# Patient Record
Sex: Male | Born: 1950 | Race: White | Hispanic: No | Marital: Married | Smoking: Never smoker
Health system: Southern US, Community
[De-identification: ages and names within clinical notes are randomized; demographics above are authoritative.]

## PROBLEM LIST (undated history)

## (undated) DIAGNOSIS — M7020 Olecranon bursitis, unspecified elbow: Secondary | ICD-10-CM

## (undated) DIAGNOSIS — I10 Essential (primary) hypertension: Secondary | ICD-10-CM

## (undated) DIAGNOSIS — Z85828 Personal history of other malignant neoplasm of skin: Secondary | ICD-10-CM

## (undated) DIAGNOSIS — E7800 Pure hypercholesterolemia, unspecified: Secondary | ICD-10-CM

## (undated) DIAGNOSIS — D239 Other benign neoplasm of skin, unspecified: Secondary | ICD-10-CM

## (undated) DIAGNOSIS — R1013 Epigastric pain: Secondary | ICD-10-CM

## (undated) DIAGNOSIS — K449 Diaphragmatic hernia without obstruction or gangrene: Secondary | ICD-10-CM

## (undated) DIAGNOSIS — K219 Gastro-esophageal reflux disease without esophagitis: Secondary | ICD-10-CM

## (undated) DIAGNOSIS — R972 Elevated prostate specific antigen [PSA]: Secondary | ICD-10-CM

## (undated) DIAGNOSIS — R739 Hyperglycemia, unspecified: Secondary | ICD-10-CM

## (undated) DIAGNOSIS — M109 Gout, unspecified: Secondary | ICD-10-CM

## (undated) DIAGNOSIS — E785 Hyperlipidemia, unspecified: Secondary | ICD-10-CM

## (undated) HISTORY — PX: GANGLION CYST EXCISION: SHX1691

## (undated) HISTORY — PX: TONSILLECTOMY: SUR1361

## (undated) HISTORY — PX: WISDOM TOOTH EXTRACTION: SHX21

## (undated) HISTORY — DX: Hyperlipidemia, unspecified: E78.5

---

## 2004-12-13 ENCOUNTER — Inpatient Hospital Stay: Payer: Self-pay | Admitting: Internal Medicine

## 2004-12-13 ENCOUNTER — Other Ambulatory Visit: Payer: Self-pay

## 2006-07-16 IMAGING — US US CAROTID DUPLEX BILAT
1 series · 14 of 24 positions shown · non-contrast
Comparison: none

REASON FOR EXAM: Status post  TIA/CVA
COMMENTS:

[Series 1: us carotid duplex bilat · 0.09mm/px · 14 of 45 slices shown]
[im 1/45]
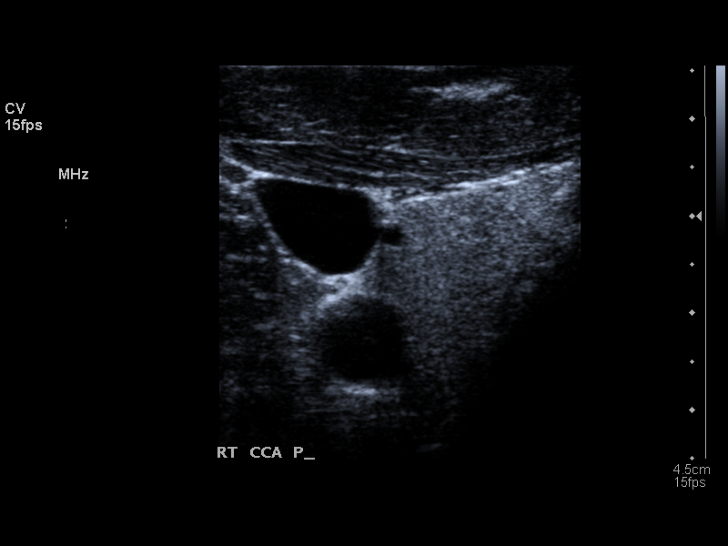
[im 4/45]
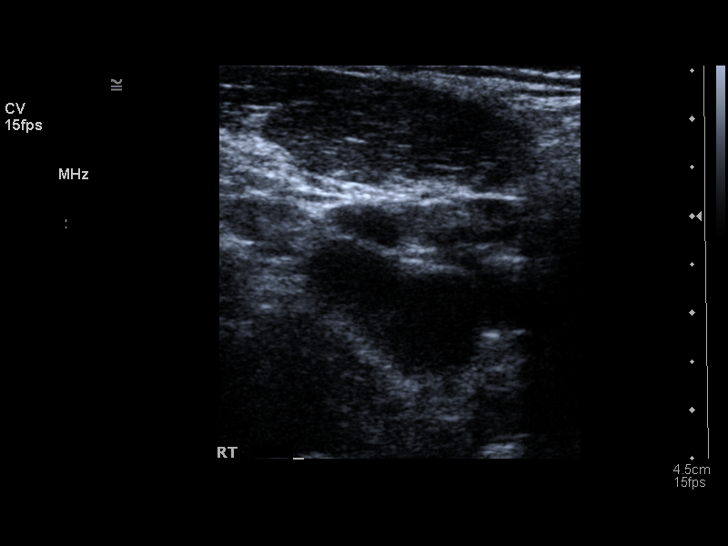
[im 8/45]
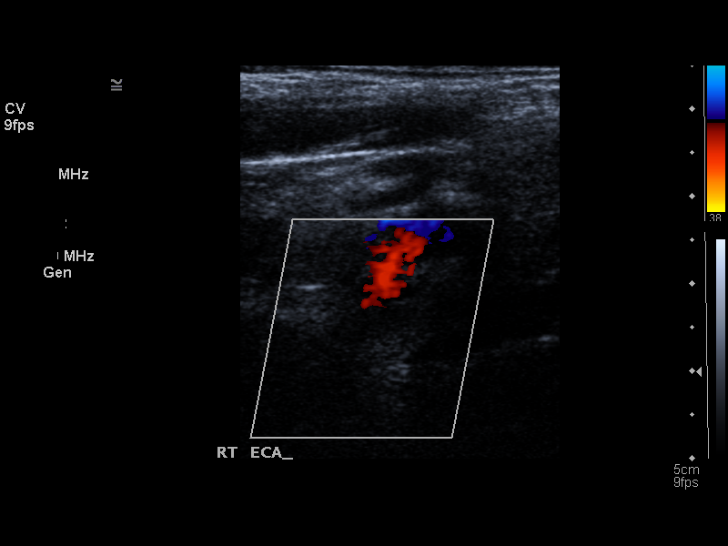
[im 12/45]
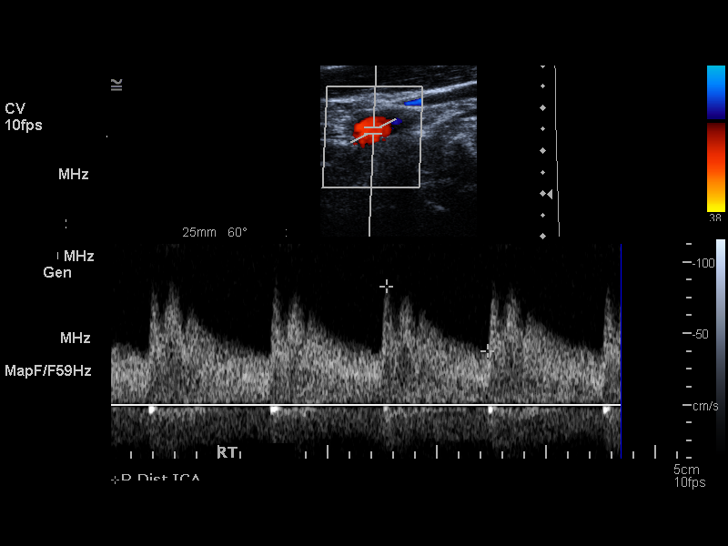
[im 14/45]
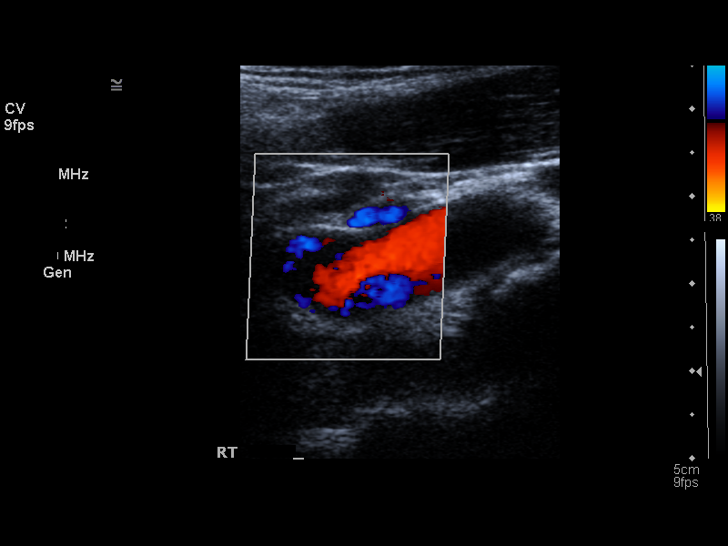
[im 18/45]
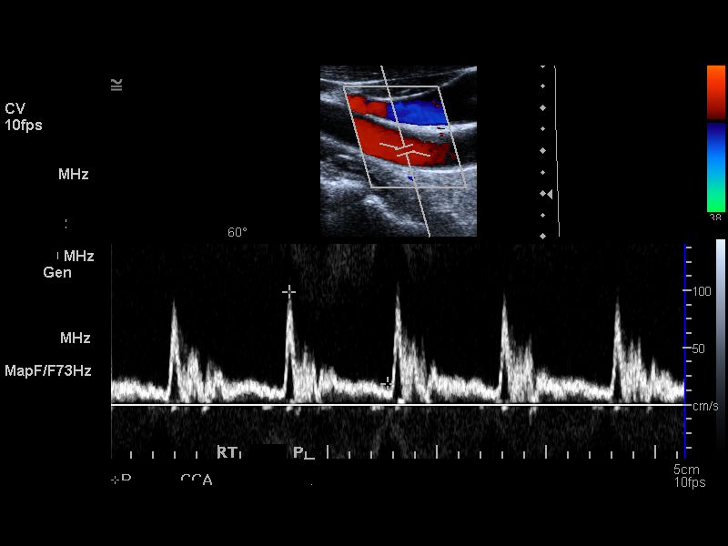
[im 22/45]
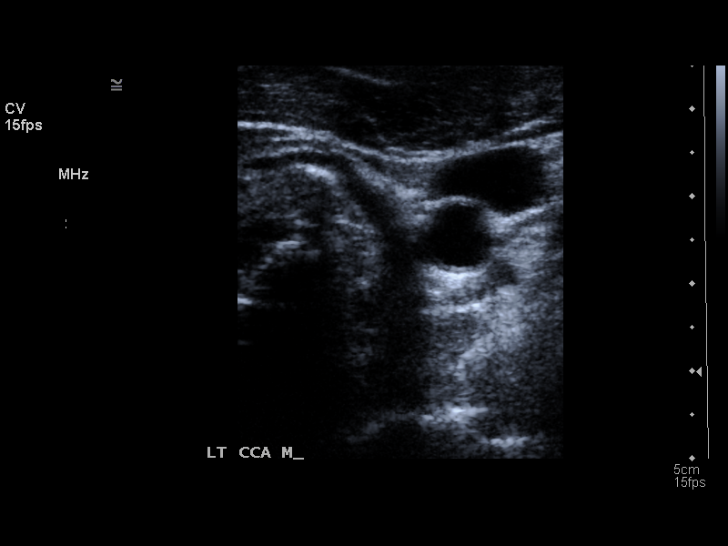
[im 23/45]
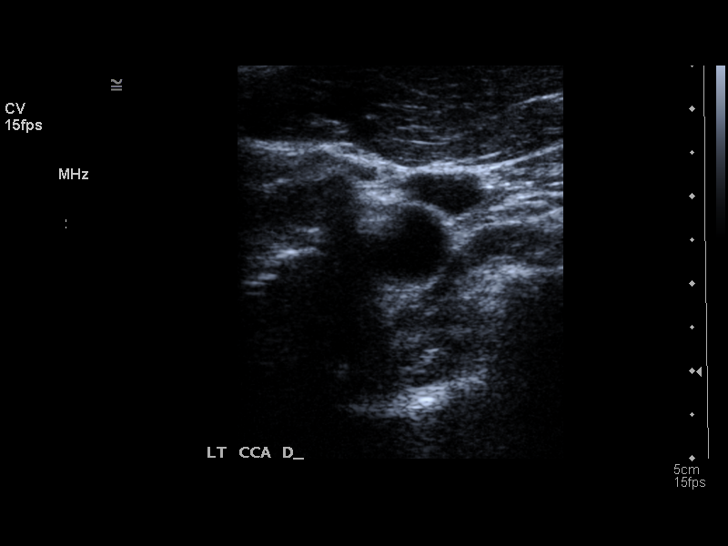
[im 27/45]
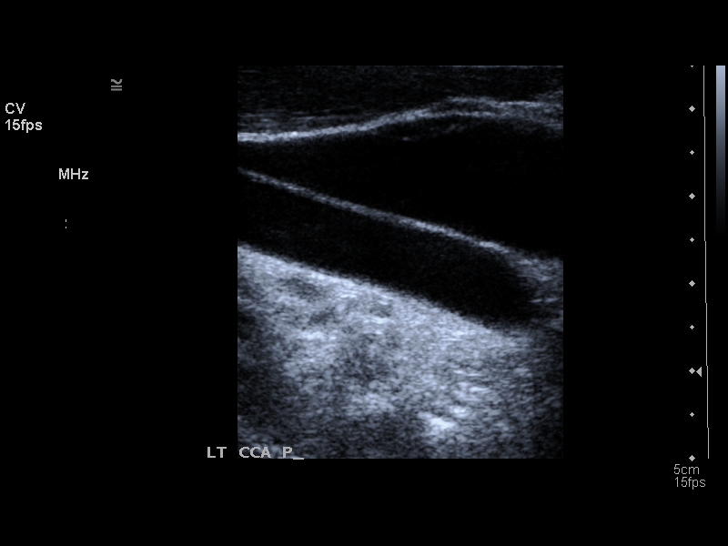
[im 31/45]
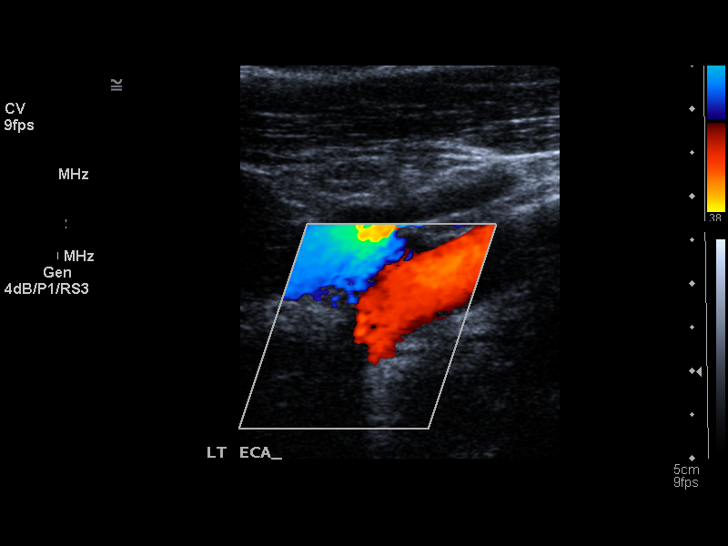
[im 35/45]
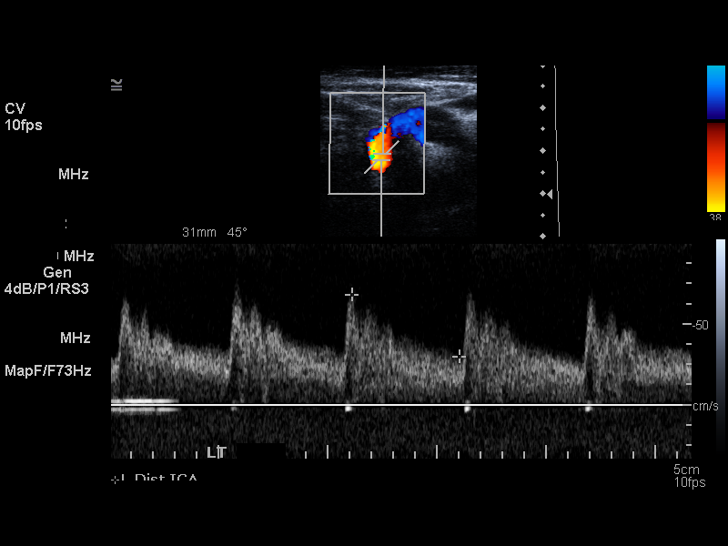
[im 37/45]
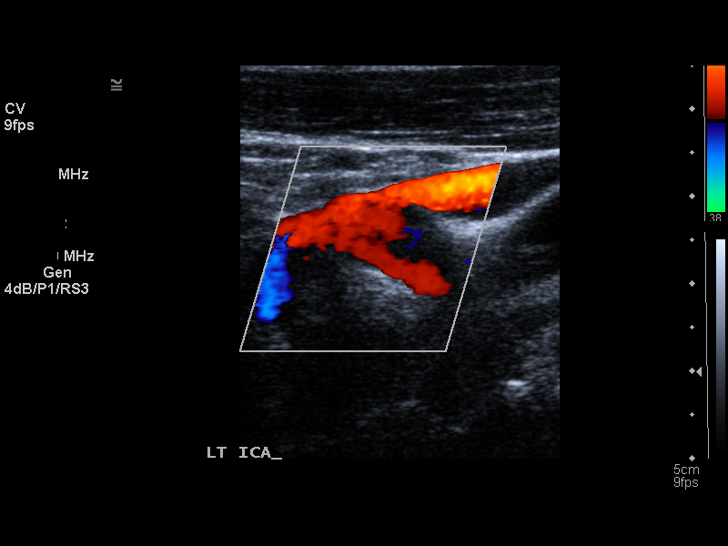
[im 41/45]
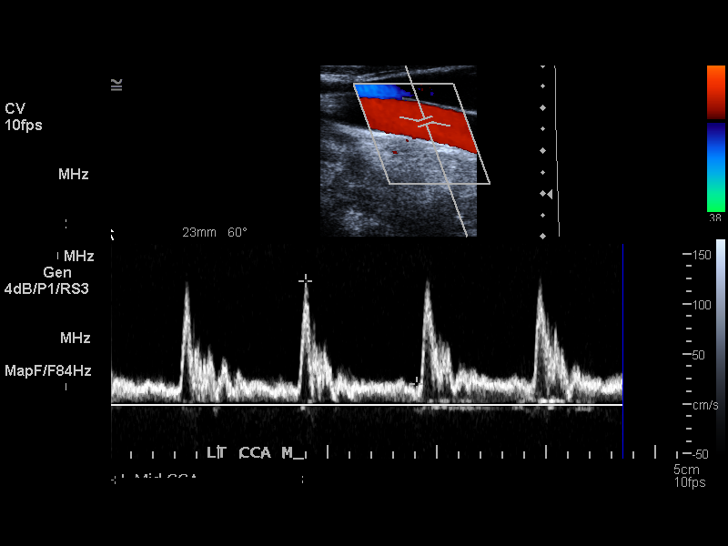
[im 45/45]
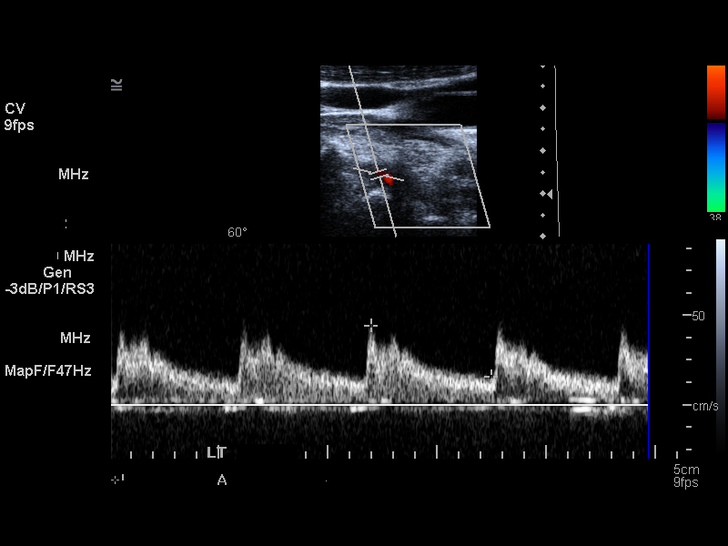

[14 of 24 positions shown; findings below may reference images not displayed]

PROCEDURE:     US  - US CAROTID DOPPLER BILATERAL  - December 13, 2004 [DATE]

RESULT:     The patient has TIA symptoms.

On the RIGHT, the peak internal carotid systolic velocity measured 83.0 cm
per second with peak common carotid velocity measuring 111.0 cm per second,
corresponding to a ratio of 0.8.

On the LEFT, the peak internal carotid systolic velocity measured 95.0 cm
per second with peak common carotid velocity measuring 124.0 cm per second,
corresponding to a ratio of 0.8.

The vertebral arteries are normal in flow direction bilaterally.
IMPRESSION: I see no evidence of hemodynamically significant carotid
stenosis.

## 2011-10-27 DIAGNOSIS — K449 Diaphragmatic hernia without obstruction or gangrene: Secondary | ICD-10-CM | POA: Insufficient documentation

## 2011-11-03 DIAGNOSIS — R739 Hyperglycemia, unspecified: Secondary | ICD-10-CM | POA: Insufficient documentation

## 2013-04-18 DIAGNOSIS — M109 Gout, unspecified: Secondary | ICD-10-CM | POA: Insufficient documentation

## 2015-05-01 ENCOUNTER — Emergency Department
Admission: EM | Admit: 2015-05-01 | Discharge: 2015-05-01 | Disposition: A | Discharge status: Home / Self Care | Payer: BLUE CROSS/BLUE SHIELD | Attending: Emergency Medicine | Admitting: Emergency Medicine

## 2015-05-01 ENCOUNTER — Emergency Department: Payer: BLUE CROSS/BLUE SHIELD

## 2015-05-01 DIAGNOSIS — S63284A Dislocation of proximal interphalangeal joint of right ring finger, initial encounter: Secondary | ICD-10-CM | POA: Insufficient documentation

## 2015-05-01 DIAGNOSIS — W108XXA Fall (on) (from) other stairs and steps, initial encounter: Secondary | ICD-10-CM | POA: Insufficient documentation

## 2015-05-01 DIAGNOSIS — Y9389 Activity, other specified: Secondary | ICD-10-CM | POA: Diagnosis not present

## 2015-05-01 DIAGNOSIS — S63434A Traumatic rupture of volar plate of right ring finger at metacarpophalangeal and interphalangeal joint, initial encounter: Secondary | ICD-10-CM | POA: Diagnosis not present

## 2015-05-01 DIAGNOSIS — S63639A Sprain of interphalangeal joint of unspecified finger, initial encounter: Secondary | ICD-10-CM

## 2015-05-01 DIAGNOSIS — Y9289 Other specified places as the place of occurrence of the external cause: Secondary | ICD-10-CM | POA: Insufficient documentation

## 2015-05-01 DIAGNOSIS — Y998 Other external cause status: Secondary | ICD-10-CM | POA: Insufficient documentation

## 2015-05-01 DIAGNOSIS — S6991XA Unspecified injury of right wrist, hand and finger(s), initial encounter: Secondary | ICD-10-CM | POA: Diagnosis present

## 2015-05-01 DIAGNOSIS — IMO0001 Reserved for inherently not codable concepts without codable children: Secondary | ICD-10-CM

## 2015-05-01 MED ORDER — OXYCODONE-ACETAMINOPHEN 5-325 MG PO TABS
1.0000 | ORAL_TABLET | Freq: Once | ORAL | Status: AC
  Administered 2015-05-01: 1 via ORAL
  Filled 2015-05-01: qty 1

## 2015-05-01 MED ORDER — LIDOCAINE HCL (PF) 1 % IJ SOLN
INTRAMUSCULAR | Status: AC
  Filled 2015-05-01: qty 5

## 2015-05-01 NOTE — ED Provider Notes (Addendum)
[facility] Emergency Department Provider Note  ____________________________________________  Time seen: Approximately 284 PM  I have reviewed the triage vital signs and the nursing notes.   HISTORY  Chief Complaint Finger Injury    HPI James Haas is a 64 y.o. male without any chronic medical issues and is presenting tonight with right ring finger pain and deformity after fall. He says that he was started walk up the stairs when he tripped and fell forward and caught himself awkwardly with his right ring finger which she had a key ring wrapped around. He did not hit his head or lose consciousness.  He is unable to move the finger at this time.   No past medical history on file.  There are no active problems to display for this patient.   No past surgical history on file.  No current outpatient prescriptions on file.  Allergies Review of patient's allergies indicates not on file.  No family history on file.  Social History Social History  Substance Use Topics  . Smoking status: Not on file  . Smokeless tobacco: Not on file  . Alcohol Use: Not on file    Review of Systems Constitutional: No fever/chills Eyes: No visual changes. ENT: No sore throat. Cardiovascular: Denies chest pain. Respiratory: Denies shortness of breath. Gastrointestinal: No abdominal pain.  No nausea, no vomiting.  No diarrhea.  No constipation. Genitourinary: Negative for dysuria. Musculoskeletal: Negative for back pain. Skin: Negative for rash. Neurological: Negative for headaches, focal weakness or numbness.  10-point ROS otherwise negative.  ____________________________________________   PHYSICAL EXAM:  VITAL SIGNS: ED Triage Vitals  Enc Vitals Group     BP 05/01/15 2030 88/56 mmHg     Pulse Rate 05/01/15 2030 45     Resp 05/01/15 2030 18     Temp 05/01/15 2030 97.9 F (36.6 C)     Temp Source 05/01/15 2030 Oral     SpO2 05/01/15 2030 99 %   Weight 05/01/15 2030 178 lb (80.74 kg)     Height 05/01/15 2030 5\' 9"  (1.753 m)     Head Cir --      Peak Flow --      Pain Score --      Pain Loc --      Pain Edu? --      Excl. in [place]? --     Constitutional: Alert and oriented. Well appearing and in no acute distress. Eyes: Conjunctivae are normal. PERRL. EOMI. Head: Atraumatic. Nose: No congestion/rhinnorhea. Neck: No stridor.   Cardiovascular: Normal rate, regular rhythm. Grossly normal heart sounds.  Good peripheral circulation. Respiratory: Normal respiratory effort.  No retractions. Lungs CTAB. Gastrointestinal: Soft and nontender. No distention. No abdominal bruits. No CVA tenderness. Musculoskeletal: No lower extremity tenderness nor edema.  No joint effusions. Right ring finger with middle and distal phalanx posteriorly displaced. Unable to range distally to the PIP joint. Sensate with less than 2 second brisk capillary refill. Neurologic:  Normal speech and language. No gross focal neurologic deficits are appreciated. No gait instability. Skin:  Skin is warm, dry and intact. No rash noted. Psychiatric: Mood and affect are normal. Speech and behavior are normal.  ____________________________________________   LABS (all labs ordered are listed, but only abnormal results are displayed)  Labs Reviewed - No data to display ____________________________________________  EKG   ____________________________________________  RADIOLOGY  Initial x-ray with posterior dislocation of the right finger PIP joint with small avulsed bone fragment anteriorly. Postreduction with interval  relocation and small bone fragment appearing to be in the volar joint space. Likely representing volar plate fracture. ____________________________________________   PROCEDURES  Reduction of dislocation Date/Time: 1010 Performed by: Doran Stabler Authorized by: Doran Stabler Consent: Verbal consent obtained. Risks and benefits: risks,  benefits and alternatives were discussed Consent given by: patient Required items: required blood products, implants, devices, and special equipment available Time out: Immediately prior to procedure a "time out" was called to verify the correct patient, procedure, equipment, support staff and site/side marked as required.  Digital block with 1% lidocaine without epi with 1.5 cc of lidocaine to either side of the MCP joint of the right ring finger with a dorsal approach.   Patient tolerance: Patient tolerated the procedure well with no immediate complications. Joint: Right ring finger PIP Reduction technique: Held hand and proximal phalanx study while dorsiflexing and pulling outward and anteriorly on the middle and distal phalanx.   Felt displaced segments clicked back into place. After reduction the patient is able to range his finger with full range of motion and says that he feels no pain.  Still with brisk capillary refill.    ____________________________________________   INITIAL IMPRESSION / ASSESSMENT AND PLAN / ED COURSE  Pertinent labs & imaging results that were available during my care of the patient were reviewed by me and considered in my medical decision making (see chart for details).  ----------------------------------------- 10:50 PM on 05/01/2015 -----------------------------------------  Satisfactory reduction on repeat x-ray. Patient placed in a straight splint and remains neurovascularly intact. Is a patient of Dr. Harden Mo. Will follow-up with his practice by calling on Monday for follow-up with either James Haas or James Haas. Patient is right-hand dominant. We'll give work note.  ____________________________________________   FINAL CLINICAL IMPRESSION(S) / ED DIAGNOSES  Right ring finger PIP dorsal dislocation. Reduced. Volar plate fracture. Initial visit.    Orbie Pyo, MD 05/01/15 2252  Patient now heart rate in the 60s with  normalized blood pressure. Patient says that he has had vagal responses to pain in the past and that is normal for his heart rate go down as well as his blood pressure when he is in pain.  Orbie Pyo, MD 05/01/15 [phone]

## 2015-05-01 NOTE — ED Notes (Signed)
Pt with dislocation noted to right ring finger. Pt denies loc. Pt states tripped on step and had keys around finger. Sensation intact to finger. Pt pale, hypotensive.

## 2015-05-01 NOTE — Discharge Instructions (Signed)
Cast or Splint Care Casts and splints support injured limbs and keep bones from moving while they heal.  HOME CARE  Keep the cast or splint uncovered during the drying period.  A plaster cast can take 24 to 48 hours to dry.  A fiberglass cast will dry in less than 1 hour.  Do not rest the cast on anything harder than a pillow for 24 hours.  Do not put weight on your injured limb. Do not put pressure on the cast. Wait for your doctor's approval.  Keep the cast or splint dry.  Cover the cast or splint with a plastic bag during baths or wet weather.  If you have a cast over your chest and belly (trunk), take sponge baths until the cast is taken off.  If your cast gets wet, dry it with a towel or blow dryer. Use the cool setting on the blow dryer.  Keep your cast or splint clean. Wash a dirty cast with a damp cloth.  Do not put any objects under your cast or splint.  Do not scratch the skin under the cast with an object. If itching is a problem, use a blow dryer on a cool setting over the itchy area.  Do not trim or cut your cast.  Do not take out the padding from inside your cast.  Exercise your joints near the cast as told by your doctor.  Raise (elevate) your injured limb on 1 or 2 pillows for the first 1 to 3 days. GET HELP IF:  Your cast or splint cracks.  Your cast or splint is too tight or too loose.  You itch badly under the cast.  Your cast gets wet or has a soft spot.  You have a bad smell coming from the cast.  You get an object stuck under the cast.  Your skin around the cast becomes red or sore.  You have new or more pain after the cast is put on. GET HELP RIGHT AWAY IF:  You have fluid leaking through the cast.  You cannot move your fingers or toes.  Your fingers or toes turn blue or white or are cool, painful, or puffy (swollen).  You have tingling or lose feeling (numbness) around the injured area.  You have bad pain or pressure under the  cast.  You have trouble breathing or have shortness of breath.  You have chest pain. Document Released: 11/24/2010 Document Revised: 03/27/2013 Document Reviewed: 01/31/2013 [facility] Patient Information 2015 [place], [place]. This information is not intended to replace advice given to you by your health care provider. Make sure you discuss any questions you have with your health care provider.  Finger Dislocation  A finger dislocation happens when your finger bones separate from where they connect with each other. It usually happens to the joint closest to your knuckle (proximal interphalangeal joint). Your doctor will put your bones back in the joint. This may be done by pulling on your finger or through surgery. A bandage (dressing) or splint will be placed around your joint. The bandage or splint holds your finger in place while it heals. HOME CARE   Rest your injured joint. Do not move it until told to do so.  Put ice on your injured joint as told by your doctor.  Put ice in a plastic bag.  Place a towel between your skin and the bag.  Leave the ice on for 15-20 minutes at a time. Do this every 2 hours while you  are awake.  Raise (elevate) your hand above your heart as told by your doctor.  Only take medicines as told by your doctor. GET HELP RIGHT AWAY IF:  Your bandage or splint becomes damaged.  Your pain becomes worse, not better.  You lose feeling in your finger or it becomes cold or white. MAKE SURE YOU:  Understand these instructions.  Will watch your condition.  Will get help right away if you are not doing well or get worse. Document Released: 07/14/2011 Document Revised: 10/17/2011 Document Reviewed: 07/14/2011 [facility] Patient Information 2015 [place], [place]. This information is not intended to replace advice given to you by your health care provider. Make sure you discuss any questions you have with your health care provider.

## 2016-11-07 DIAGNOSIS — M7020 Olecranon bursitis, unspecified elbow: Secondary | ICD-10-CM | POA: Insufficient documentation

## 2016-11-10 DIAGNOSIS — Z85828 Personal history of other malignant neoplasm of skin: Secondary | ICD-10-CM | POA: Insufficient documentation

## 2016-12-01 IMAGING — CR DG FINGER RING 2+V*R*
1 series · 4 of 4 positions shown · non-contrast
Comparison: 05/01/2015

CLINICAL DATA: Postreduction ring finger.

EXAM:
RIGHT RING FINGER 2+V

[Series 1: pa · 0.17mm/px · 4 of 4 slices shown]
[im 1/4]
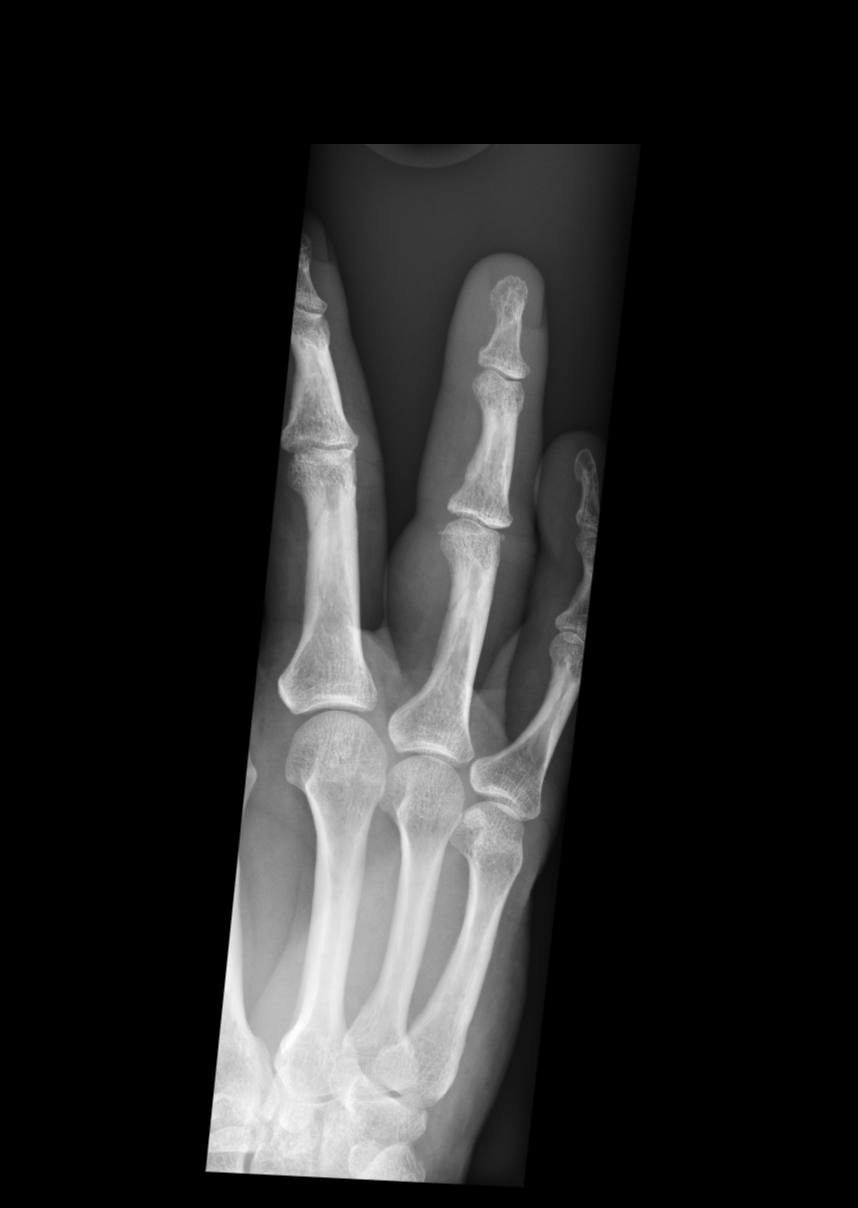
[im 2/4]
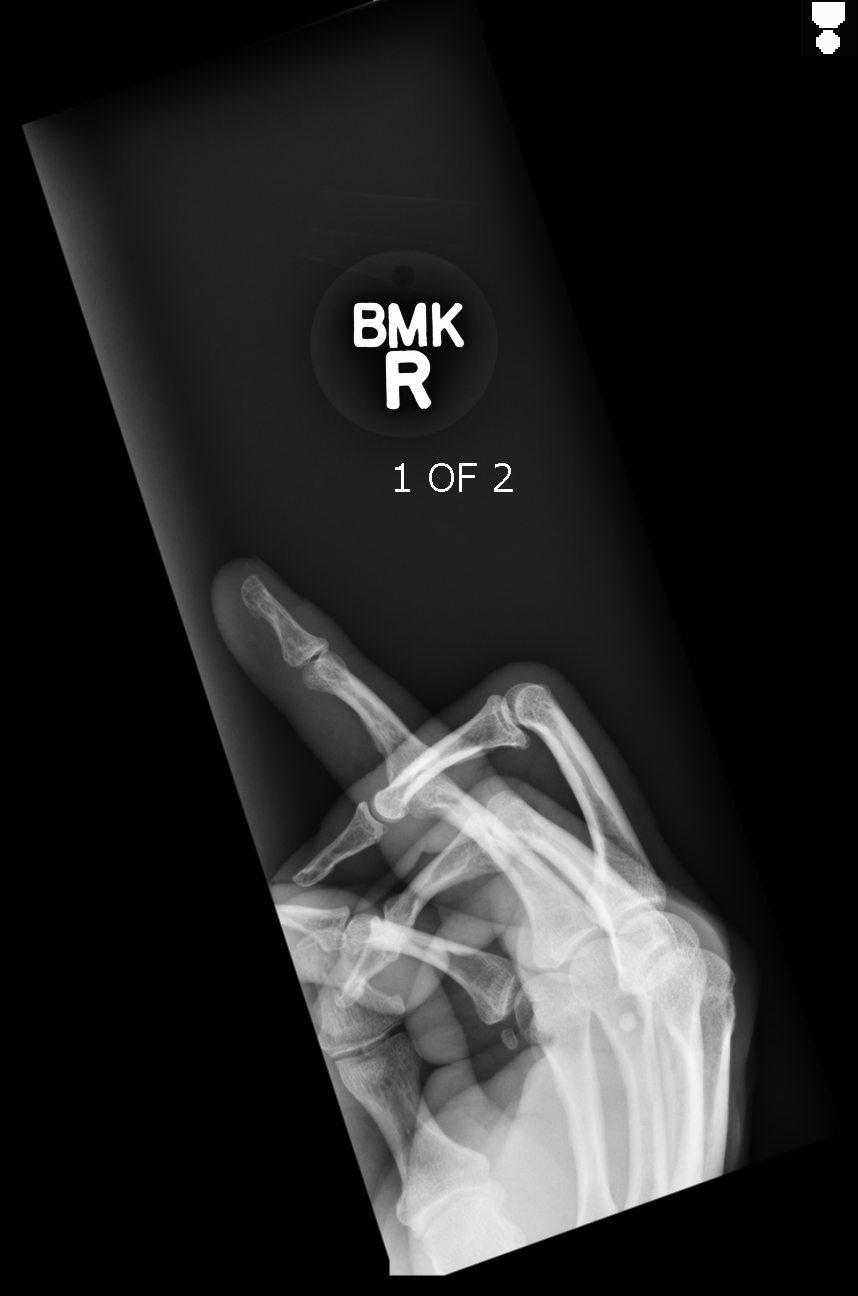
[im 3/4]
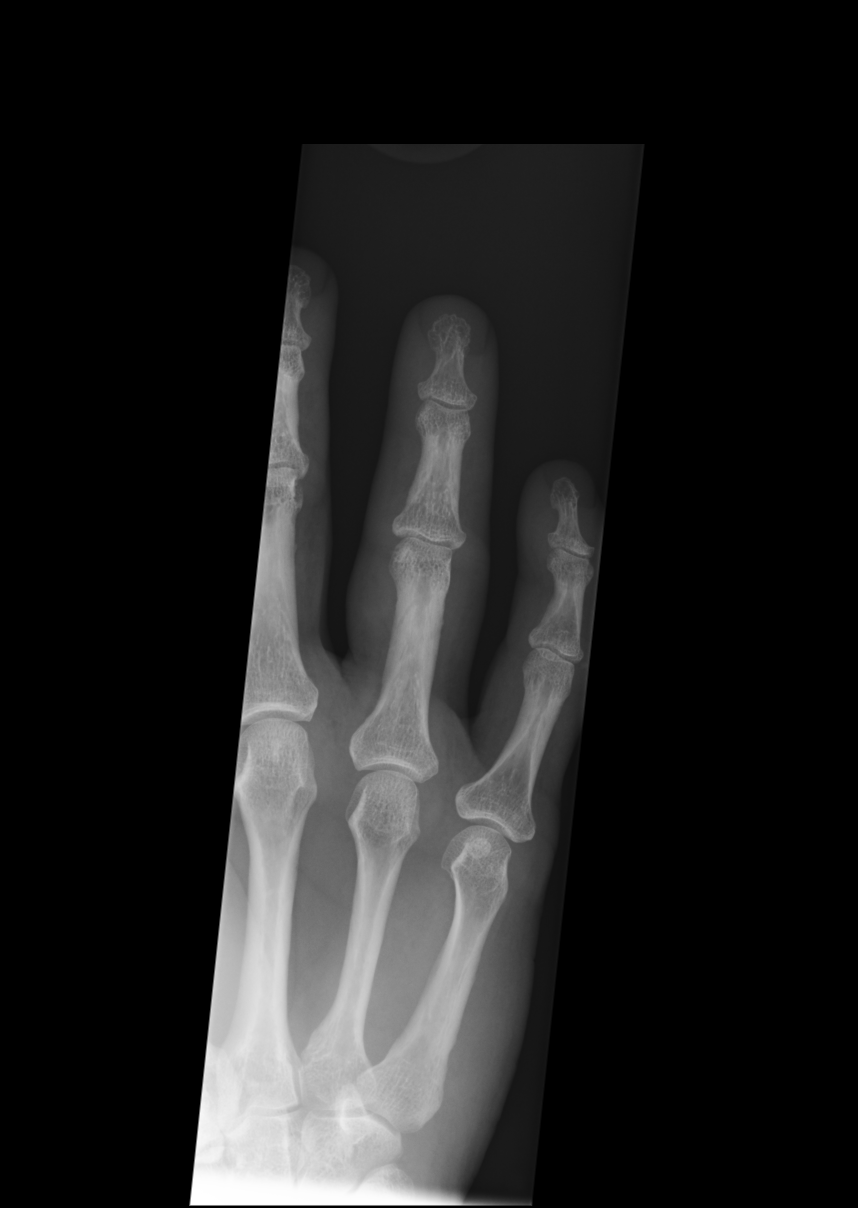
[im 4/4]
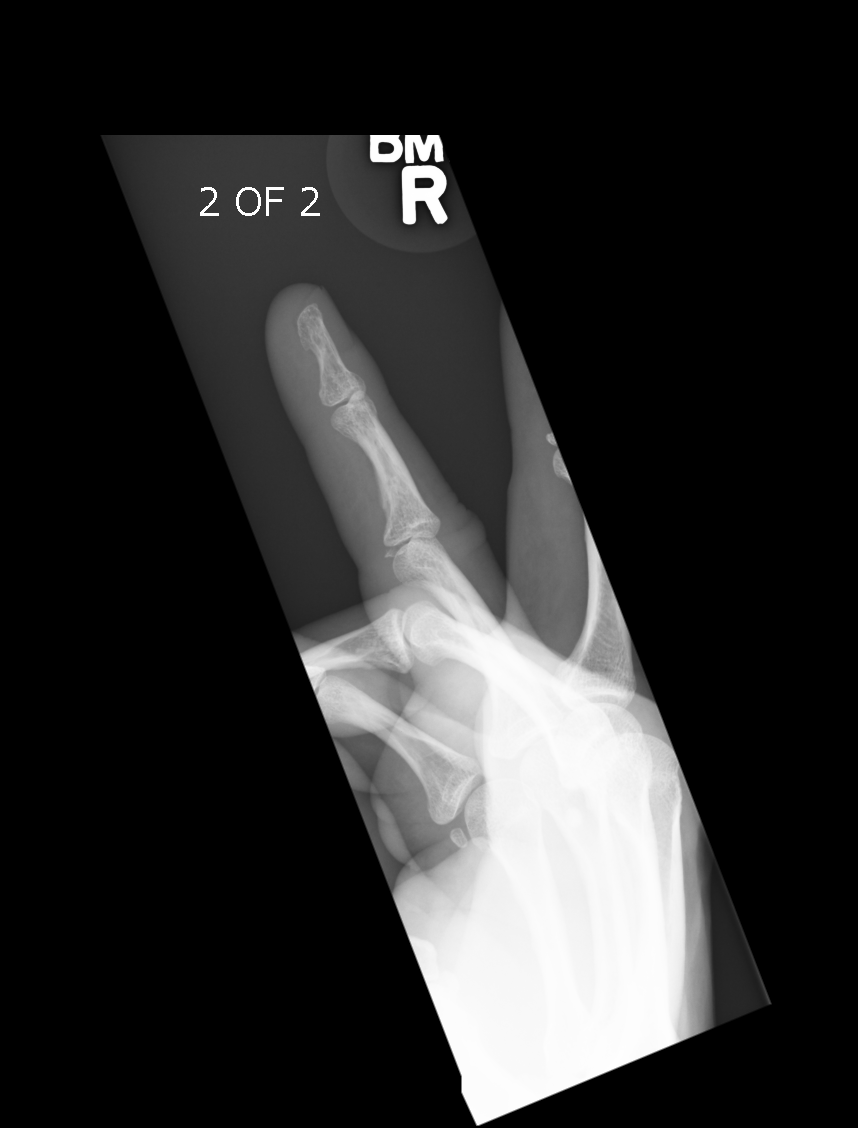

[4 of 4 positions shown; findings below may reference images not displayed]

FINDINGS: Interval reduction of previous proximal interphalangeal joint
dislocation of the right fourth finger. The joint appears to be
relocated in good position. There is a fracture fragment in the
volar joint space probably representing a volar plate injury. Soft
tissue swelling.
IMPRESSION: Interval relocation of the proximal interphalangeal joint of the
right fourth finger. Small bone fragment appears to be in the volar
joint space probably representing volar plate fracture.

## 2017-11-15 ENCOUNTER — Encounter: Payer: Self-pay | Admitting: Family Medicine

## 2018-01-08 ENCOUNTER — Other Ambulatory Visit: Payer: Self-pay

## 2018-01-08 ENCOUNTER — Encounter (INDEPENDENT_AMBULATORY_CARE_PROVIDER_SITE_OTHER): Payer: Self-pay

## 2018-01-08 ENCOUNTER — Ambulatory Visit: Payer: Managed Care, Other (non HMO) | Admitting: Gastroenterology

## 2018-01-08 ENCOUNTER — Encounter: Payer: Self-pay | Admitting: Gastroenterology

## 2018-01-08 VITALS — BP 148/95 | HR 76 | Ht 69.0 in | Wt 195.0 lb

## 2018-01-08 DIAGNOSIS — E7800 Pure hypercholesterolemia, unspecified: Secondary | ICD-10-CM | POA: Insufficient documentation

## 2018-01-08 DIAGNOSIS — K219 Gastro-esophageal reflux disease without esophagitis: Secondary | ICD-10-CM

## 2018-01-08 DIAGNOSIS — R1013 Epigastric pain: Secondary | ICD-10-CM

## 2018-01-08 DIAGNOSIS — D239 Other benign neoplasm of skin, unspecified: Secondary | ICD-10-CM | POA: Insufficient documentation

## 2018-01-08 DIAGNOSIS — I10 Essential (primary) hypertension: Secondary | ICD-10-CM | POA: Insufficient documentation

## 2018-01-08 NOTE — Progress Notes (Signed)
Gastroenterology Consultation  Referring Provider:     Hortencia Pilar, MD Primary Care Physician:  Hortencia Pilar, MD Primary Gastroenterologist:  Dr. Allen Norris     Reason for Consultation:     Nausea        HPI:   James Haas is a 67 y.o. y/o male referred for consultation & management of nausea by Dr. Hortencia Pilar, MD.  This patient comes in today with a history of having a hiatal hernia diagnosed back about 24 years ago.  The patient reports that he has been doing well on Protonix.  The patient has very infrequent acid breakthrough which he describes as burning in his throat but denies any burning in his chest or esophagus.  The patient states that he has attacks of epigastric fullness that he feels to be trapped gas that he relates to his hiatal hernia.  There is no report of any dysphasia or black stools or bloody stools.  The patient also denies any vomiting but does state that he does have intermittent nausea.  The patient had a colonoscopy in 2015 and a previous colonoscopy in 2005.  There is no report of any adenomatous polyps that any of these procedures.  The patient denies any weight loss fevers or chills.  History reviewed. No pertinent past medical history.  History reviewed. No pertinent surgical history.  Prior to Admission medications   Medication Sig Start Date End Date Taking? Authorizing Provider  aspirin EC 81 MG tablet Take by mouth.   Yes [provider]  Azelastine-Fluticasone (DYMISTA) 137-50 MCG/ACT SUSP Dymista 137 mcg-50 mcg/spray nasal spray 05/11/17  Yes [provider]  chlorthalidone (HYGROTON) 25 MG tablet  12/13/17  Yes [provider]  fluticasone (FLONASE) 50 MCG/ACT nasal spray fluticasone propionate 50 mcg/actuation nasal spray,suspension 05/11/17  Yes [provider]  lisinopril (PRINIVIL,ZESTRIL) 40 MG tablet  12/13/17  Yes [provider]  Multiple Vitamin (MULTIVITAMIN) tablet Take by mouth.   Yes  [provider]  Omega-3 1000 MG CAPS Take by mouth.   Yes [provider]  pantoprazole (PROTONIX) 40 MG tablet  11/30/17  Yes [provider]  rosuvastatin (CRESTOR) 5 MG tablet  12/26/17  Yes [provider]  triamcinolone cream (KENALOG) 0.1 %  10/23/17  Yes [provider]  hydrochlorothiazide (HYDRODIURIL) 12.5 MG tablet hydrochlorothiazide 12.5 mg tablet  Take 1 tablet every day by oral route.    [provider]  hydrOXYzine (ATARAX/VISTARIL) 10 MG tablet hydroxyzine HCl 10 mg tablet    [provider]  ipratropium (ATROVENT) 0.03 % nasal spray ipratropium bromide 0.03 % nasal spray    [provider]    History reviewed. No pertinent family history.   Social History   Tobacco Use  . Smoking status: Not on file  Substance Use Topics  . Alcohol use: Not on file  . Drug use: Not on file    Allergies as of 01/08/2018  . (No Known Allergies)    Review of Systems:    All systems reviewed and negative except where noted in HPI.   Physical Exam:  BP (!) 148/95   Pulse 76   Ht 5\' 9"  (1.753 m)   Wt 195 lb (88.5 kg)   BMI 28.80 kg/m  No LMP for male patient. Psych:  Alert and cooperative. Normal mood and affect. General:   Alert,  Well-developed, well-nourished, pleasant and cooperative in NAD Head:  Normocephalic and atraumatic. Eyes:  Sclera clear, no icterus.  Conjunctiva pink. Ears:  Normal auditory acuity. Nose:  No deformity, discharge, or lesions. Mouth:  No deformity or lesions,oropharynx pink & moist. Neck:  Supple; no masses or thyromegaly. Lungs:  Respirations even and unlabored.  Clear throughout to auscultation.   No wheezes, crackles, or rhonchi. No acute distress. Heart:  Regular rate and rhythm; no murmurs, clicks, rubs, or gallops. Abdomen:  Normal bowel sounds.  No bruits.  Soft, non-tender and non-distended without masses, hepatosplenomegaly or hernias noted.  No guarding or rebound  tenderness.  Negative Carnett sign.   Rectal:  Deferred.  Msk:  Symmetrical without gross deformities.  Good, equal movement & strength bilaterally. Pulses:  Normal pulses noted. Extremities:  No clubbing or edema.  No cyanosis. Neurologic:  Alert and oriented x3;  grossly normal neurologically. Skin:  Intact without significant lesions or rashes.  No jaundice. Lymph Nodes:  No significant cervical adenopathy. Psych:  Alert and cooperative. Normal mood and affect.  Imaging Studies: No results found.  Assessment and Plan:   James Haas is a 67 y.o. y/o male who has a history of having a hiatal hernia with some increased nausea and very infrequent acid breakthrough.  The patient reports his acid breakthrough to be burning in his throat but denies any chest burning indicating that the patient does not have good sensation of acid reflux in his esophagus.  This is evident because he feels the burning in his throat but not in his chest.  Due to the patient's long-standing heartburn and increased nausea with dyspepsia and bloating when he feels a gas bubble is trapped, he will be set up for a upper endoscopy. I have discussed risks & benefits which include, but are not limited to, bleeding, infection, perforation & drug reaction.  The patient agrees with this plan & written consent will be obtained.     Lucilla Lame, MD. Marval Regal   Note: This dictation was prepared with Dragon dictation along with smaller phrase technology. Any transcriptional errors that result from this process are unintentional.

## 2018-02-07 ENCOUNTER — Other Ambulatory Visit: Payer: Self-pay

## 2018-02-07 ENCOUNTER — Encounter: Payer: Self-pay | Admitting: *Deleted

## 2018-02-15 NOTE — Discharge Instructions (Signed)
General Anesthesia, Adult, Care After °These instructions provide you with information about caring for yourself after your procedure. Your health care provider may also give you more specific instructions. Your treatment has been planned according to current medical practices, but problems sometimes occur. Call your health care provider if you have any problems or questions after your procedure. °What can I expect after the procedure? °After the procedure, it is common to have: °· Vomiting. °· A sore throat. °· Mental slowness. ° °It is common to feel: °· Nauseous. °· Cold or shivery. °· Sleepy. °· Tired. °· Sore or achy, even in parts of your body where you did not have surgery. ° °Follow these instructions at home: °For at least 24 hours after the procedure: °· Do not: °? Participate in activities where you could fall or become injured. °? Drive. °? Use heavy machinery. °? Drink alcohol. °? Take sleeping pills or medicines that cause drowsiness. °? Make important decisions or sign legal documents. °? Take care of children on your own. °· Rest. °Eating and drinking °· If you vomit, drink water, juice, or soup when you can drink without vomiting. °· Drink enough fluid to keep your urine clear or pale yellow. °· Make sure you have little or no nausea before eating solid foods. °· Follow the diet recommended by your health care provider. °General instructions °· Have a responsible adult stay with you until you are awake and alert. °· Return to your normal activities as told by your health care provider. Ask your health care provider what activities are safe for you. °· Take over-the-counter and prescription medicines only as told by your health care provider. °· If you smoke, do not smoke without supervision. °· Keep all follow-up visits as told by your health care provider. This is important. °Contact a health care provider if: °· You continue to have nausea or vomiting at home, and medicines are not helpful. °· You  cannot drink fluids or start eating again. °· You cannot urinate after 8-12 hours. °· You develop a skin rash. °· You have fever. °· You have increasing redness at the site of your procedure. °Get help right away if: °· You have difficulty breathing. °· You have chest pain. °· You have unexpected bleeding. °· You feel that you are having a life-threatening or urgent problem. °This information is not intended to replace advice given to you by your health care provider. Make sure you discuss any questions you have with your health care provider. °Document Released: 10/31/2000 Document Revised: 12/28/2015 Document Reviewed: 07/09/2015 °Elsevier Interactive Patient Education © 2018 [employer] ° °

## 2018-02-16 ENCOUNTER — Ambulatory Visit
Admission: RE | Admit: 2018-02-16 | Discharge: 2018-02-16 | Disposition: A | Discharge status: Home / Self Care | Payer: Managed Care, Other (non HMO) | Source: Ambulatory Visit | Attending: Gastroenterology | Admitting: Gastroenterology

## 2018-02-16 ENCOUNTER — Ambulatory Visit: Payer: Managed Care, Other (non HMO) | Admitting: Anesthesiology

## 2018-02-16 ENCOUNTER — Encounter: Payer: Self-pay | Admitting: Gastroenterology

## 2018-02-16 ENCOUNTER — Encounter
Admission: RE | Disposition: A | Discharge status: Home / Self Care | Payer: Self-pay | Source: Ambulatory Visit | Attending: Gastroenterology

## 2018-02-16 DIAGNOSIS — K30 Functional dyspepsia: Secondary | ICD-10-CM | POA: Diagnosis present

## 2018-02-16 DIAGNOSIS — I10 Essential (primary) hypertension: Secondary | ICD-10-CM | POA: Insufficient documentation

## 2018-02-16 DIAGNOSIS — K219 Gastro-esophageal reflux disease without esophagitis: Secondary | ICD-10-CM | POA: Insufficient documentation

## 2018-02-16 DIAGNOSIS — Z79899 Other long term (current) drug therapy: Secondary | ICD-10-CM | POA: Insufficient documentation

## 2018-02-16 DIAGNOSIS — R1013 Epigastric pain: Secondary | ICD-10-CM | POA: Diagnosis not present

## 2018-02-16 DIAGNOSIS — K2950 Unspecified chronic gastritis without bleeding: Secondary | ICD-10-CM | POA: Diagnosis not present

## 2018-02-16 DIAGNOSIS — Z7982 Long term (current) use of aspirin: Secondary | ICD-10-CM | POA: Insufficient documentation

## 2018-02-16 HISTORY — DX: Gastro-esophageal reflux disease without esophagitis: K21.9

## 2018-02-16 HISTORY — PX: ESOPHAGOGASTRODUODENOSCOPY (EGD) WITH PROPOFOL: SHX5813

## 2018-02-16 HISTORY — DX: Essential (primary) hypertension: I10

## 2018-02-16 SURGERY — ESOPHAGOGASTRODUODENOSCOPY (EGD) WITH PROPOFOL
Anesthesia: General | Wound class: Clean Contaminated

## 2018-02-16 MED ORDER — ACETAMINOPHEN 325 MG PO TABS: 650.0000 mg | ORAL_TABLET | Freq: Once | ORAL | Status: DC | PRN

## 2018-02-16 MED ORDER — PROPOFOL 10 MG/ML IV BOLUS
INTRAVENOUS | Status: DC | PRN
  Administered 2018-02-16 (×2): 80 mg via INTRAVENOUS

## 2018-02-16 MED ORDER — ONDANSETRON HCL 4 MG/2ML IJ SOLN: 4.0000 mg | Freq: Once | INTRAMUSCULAR | Status: DC | PRN

## 2018-02-16 MED ORDER — GLYCOPYRROLATE 0.2 MG/ML IJ SOLN
INTRAMUSCULAR | Status: DC | PRN
  Administered 2018-02-16: 0.2 mg via INTRAVENOUS

## 2018-02-16 MED ORDER — ACETAMINOPHEN 160 MG/5ML PO SOLN: 325.0000 mg | ORAL | Status: DC | PRN

## 2018-02-16 MED ORDER — LIDOCAINE HCL (CARDIAC) PF 100 MG/5ML IV SOSY
PREFILLED_SYRINGE | INTRAVENOUS | Status: DC | PRN
  Administered 2018-02-16: 100 mg via INTRAVENOUS

## 2018-02-16 MED ORDER — LACTATED RINGERS IV SOLN
INTRAVENOUS | Status: DC
  Administered 2018-02-16 (×2): via INTRAVENOUS

## 2018-02-16 MED ORDER — SODIUM CHLORIDE 0.9 % IV SOLN: INTRAVENOUS | Status: DC

## 2018-02-16 SURGICAL SUPPLY — 33 items
BALLN DILATOR 10-12 8 (BALLOONS)
BALLN DILATOR 12-15 8 (BALLOONS)
BALLN DILATOR 15-18 8 (BALLOONS)
BALLN DILATOR CRE 0-12 8 (BALLOONS)
BALLN DILATOR ESOPH 8 10 CRE (MISCELLANEOUS) IMPLANT
BALLOON DILATOR 12-15 8 (BALLOONS) IMPLANT
BALLOON DILATOR 15-18 8 (BALLOONS) IMPLANT
BALLOON DILATOR CRE 0-12 8 (BALLOONS) IMPLANT
BLOCK BITE 60FR ADLT L/F GRN (MISCELLANEOUS) ×2 IMPLANT
CANISTER SUCT 1200ML W/VALVE (MISCELLANEOUS) ×2 IMPLANT
CLIP HMST 235XBRD CATH ROT (MISCELLANEOUS) IMPLANT
CLIP RESOLUTION 360 11X235 (MISCELLANEOUS)
ELECT REM PT RETURN 9FT ADLT (ELECTROSURGICAL)
ELECTRODE REM PT RTRN 9FT ADLT (ELECTROSURGICAL) IMPLANT
FCP ESCP3.2XJMB 240X2.8X (MISCELLANEOUS)
FORCEPS BIOP RAD 4 LRG CAP 4 (CUTTING FORCEPS) ×2 IMPLANT
FORCEPS BIOP RJ4 240 W/NDL (MISCELLANEOUS)
FORCEPS ESCP3.2XJMB 240X2.8X (MISCELLANEOUS) IMPLANT
GOWN CVR UNV OPN BCK APRN NK (MISCELLANEOUS) ×2 IMPLANT
GOWN ISOL THUMB LOOP REG UNIV (MISCELLANEOUS) ×2
INJECTOR VARIJECT VIN23 (MISCELLANEOUS) IMPLANT
KIT DEFENDO VALVE AND CONN (KITS) IMPLANT
KIT ENDO PROCEDURE OLY (KITS) ×2 IMPLANT
MARKER SPOT ENDO TATTOO 5ML (MISCELLANEOUS) IMPLANT
RETRIEVER NET PLAT FOOD (MISCELLANEOUS) IMPLANT
SNARE SHORT THROW 13M SML OVAL (MISCELLANEOUS) IMPLANT
SNARE SHORT THROW 30M LRG OVAL (MISCELLANEOUS) IMPLANT
SPOT EX ENDOSCOPIC TATTOO (MISCELLANEOUS)
SYR INFLATION 60ML (SYRINGE) IMPLANT
TRAP ETRAP POLY (MISCELLANEOUS) IMPLANT
VARIJECT INJECTOR VIN23 (MISCELLANEOUS)
WATER STERILE IRR 250ML POUR (IV SOLUTION) ×2 IMPLANT
WIRE CRE 18-20MM 8CM F G (MISCELLANEOUS) IMPLANT

## 2018-02-16 NOTE — Anesthesia Procedure Notes (Signed)
Procedure Name: MAC Date/Time: 02/16/2018 10:19 AM Performed by: Lind Guest, CRNA Pre-anesthesia Checklist: Patient identified, Emergency Drugs available, Suction available, Patient being monitored and Timeout performed Patient Re-evaluated:Patient Re-evaluated prior to induction Oxygen Delivery Method: Nasal cannula

## 2018-02-16 NOTE — Anesthesia Preprocedure Evaluation (Signed)
Anesthesia Evaluation  Patient identified by MRN, date of birth, ID band Patient awake    Reviewed: Allergy & Precautions, NPO status , Patient's Chart, lab work & pertinent test results  History of Anesthesia Complications Negative for: history of anesthetic complications  Airway Mallampati: IV  TM Distance: >3 FB Neck ROM: Full    Dental  (+)    Pulmonary  Snoring    Pulmonary exam normal breath sounds clear to auscultation       Cardiovascular Exercise Tolerance: Good hypertension, Normal cardiovascular exam Rhythm:Regular Rate:Normal     Neuro/Psych negative neurological ROS     GI/Hepatic GERD  ,  Endo/Other  negative endocrine ROS  Renal/GU negative Renal ROS     Musculoskeletal   Abdominal   Peds  Hematology negative hematology ROS (+)   Anesthesia Other Findings   Reproductive/Obstetrics                             Anesthesia Physical Anesthesia Plan  ASA: II  Anesthesia Plan: General   Post-op Pain Management:    Induction: Intravenous  PONV Risk Score and Plan: 2 and Propofol infusion and TIVA  Airway Management Planned: Natural Airway  Additional Equipment:   Intra-op Plan:   Post-operative Plan:   Informed Consent: I have reviewed the patients History and Physical, chart, labs and discussed the procedure including the risks, benefits and alternatives for the proposed anesthesia with the patient or authorized representative who has indicated his/her understanding and acceptance.     Plan Discussed with: CRNA  Anesthesia Plan Comments:         Anesthesia Quick Evaluation

## 2018-02-16 NOTE — Op Note (Signed)
[facility] Gastroenterology Patient Name: James Haas Procedure Date: 02/16/2018 10:07 AM MRN: [id number] Account #: [id number] Date of Birth: [birth year] Admit Type: Outpatient Age: 67 Room: [facility] OR ROOM 01 Gender: Male Note Status: Finalized Procedure:            Upper GI endoscopy Indications:          Functional Dyspepsia Providers:            Lucilla Lame MD, MD Referring MD:         Kerin Perna MD, MD (Referring MD) Medicines:            Propofol per Anesthesia Complications:        No immediate complications. Procedure:            Pre-Anesthesia Assessment:                       - Prior to the procedure, a History and Physical was                        performed, and patient medications and allergies were                        reviewed. The patient's tolerance of previous                        anesthesia was also reviewed. The risks and benefits of                        the procedure and the sedation options and risks were                        discussed with the patient. All questions were                        answered, and informed consent was obtained. Prior                        Anticoagulants: The patient has taken no previous                        anticoagulant or antiplatelet agents. ASA Grade                        Assessment: II - A patient with mild systemic disease.                        After reviewing the risks and benefits, the patient was                        deemed in satisfactory condition to undergo the                        procedure.                       After obtaining informed consent, the endoscope was                        passed under direct vision. Throughout the procedure,  the patient's blood pressure, pulse, and oxygen                        saturations were monitored continuously. The was                        introduced through the mouth, and advanced to the   second part of duodenum. The upper GI endoscopy was                        accomplished without difficulty. The patient tolerated                        the procedure well. Findings:      The esophagus was normal.      The stomach was normal.      The examined duodenum was normal.      Biopsies were taken with a cold forceps in the gastric antrum for       histology. Impression:           - Normal esophagus.                       - Normal stomach.                       - Normal examined duodenum.                       - Biopsies were taken with a cold forceps for histology                        in the gastric antrum. Recommendation:       - Discharge patient to home.                       - Resume previous diet.                       - Continue present medications.                       - Await pathology results. Procedure Code(s):    --- Professional ---                       [phone], Esophagogastroduodenoscopy, flexible, transoral;                        with biopsy, single or multiple Diagnosis Code(s):    --- Professional ---                       K30, Functional dyspepsia CPT copyright 2017 American Medical Association. All rights reserved. The codes documented in this report are preliminary and upon coder review may  be revised to meet current compliance requirements. Lucilla Lame MD, MD 02/16/2018 10:25:24 AM This report has been signed electronically. Number of Addenda: 0 Note Initiated On: 02/16/2018 10:07 AM      [facility]

## 2018-02-16 NOTE — H&P (Signed)
James Lame, MD [address] [place], [place] [address] [phone] Fax : [phone]  Primary Care Physician:  Hortencia Pilar, MD Primary Gastroenterologist:  Dr. Allen Norris  Pre-Procedure History & Physical: HPI:  James Haas is a 67 y.o. male is here for an endoscopy.   Past Medical History:  Diagnosis Date  . GERD (gastroesophageal reflux disease)   . Hypertension     Past Surgical History:  Procedure Laterality Date  . TONSILLECTOMY     as child  . WISDOM TOOTH EXTRACTION      Prior to Admission medications   Medication Sig Start Date End Date Taking? Authorizing Provider  chlorthalidone (HYGROTON) 25 MG tablet  12/13/17  Yes [provider]  lisinopril (PRINIVIL,ZESTRIL) 40 MG tablet  12/13/17  Yes [provider]  Multiple Vitamin (MULTIVITAMIN) tablet Take by mouth.   Yes [provider]  Omega-3 1000 MG CAPS Take by mouth.   Yes [provider]  pantoprazole (PROTONIX) 40 MG tablet  11/30/17  Yes [provider]  rosuvastatin (CRESTOR) 5 MG tablet  12/26/17  Yes [provider]  aspirin EC 81 MG tablet Take by mouth.    [provider]  Azelastine-Fluticasone [facility]) 137-50 MCG/ACT SUSP Dymista 137 mcg-50 mcg/spray nasal spray 05/11/17   [provider]  fluticasone (FLONASE) 50 MCG/ACT nasal spray fluticasone propionate 50 mcg/actuation nasal spray,suspension 05/11/17   [provider]  hydrochlorothiazide (HYDRODIURIL) 12.5 MG tablet hydrochlorothiazide 12.5 mg tablet  Take 1 tablet every day by oral route.    [provider]  hydrOXYzine (ATARAX/VISTARIL) 10 MG tablet hydroxyzine HCl 10 mg tablet    [provider]  ipratropium (ATROVENT) 0.03 % nasal spray ipratropium bromide 0.03 % nasal spray    [provider]  triamcinolone cream (KENALOG) 0.1 %  10/23/17   [provider]    Allergies as of 01/08/2018  . (No Known  Allergies)    History reviewed. No pertinent family history.  Social History   Socioeconomic History  . Marital status: Married    Spouse name: Not on file  . Number of children: Not on file  . Years of education: Not on file  . Highest education level: Not on file  Occupational History  . Not on file  Social Needs  . Financial resource strain: Not on file  . Food insecurity:    Worry: Not on file    Inability: Not on file  . Transportation needs:    Medical: Not on file    Non-medical: Not on file  Tobacco Use  . Smoking status: Never Smoker  . Smokeless tobacco: Never Used  Substance and Sexual Activity  . Alcohol use: Yes    Alcohol/week: 6.0 oz    Types: 10 Cans of beer per week  . Drug use: Never  . Sexual activity: Not on file  Lifestyle  . Physical activity:    Days per week: Not on file    Minutes per session: Not on file  . Stress: Not on file  Relationships  . Social connections:    Talks on phone: Not on file    Gets together: Not on file    Attends religious service: Not on file    Active member of club or organization: Not on file    Attends meetings of clubs or organizations: Not on file    Relationship status: Not on file  . Intimate partner violence:    Fear of current or  ex partner: Not on file    Emotionally abused: Not on file    Physically abused: Not on file    Forced sexual activity: Not on file  Other Topics Concern  . Not on file  Social History Narrative  . Not on file    Review of Systems: See HPI, otherwise negative ROS  Physical Exam: BP (!) 145/94   Pulse 71   Temp 97.7 F (36.5 C) (Temporal)   Resp 16   Ht 5\' 9"  (1.753 m)   Wt 194 lb (88 kg)   SpO2 100%   BMI 28.65 kg/m  General:   Alert,  pleasant and cooperative in NAD Head:  Normocephalic and atraumatic. Neck:  Supple; no masses or thyromegaly. Lungs:  Clear throughout to auscultation.    Heart:  Regular rate and rhythm. Abdomen:  Soft, nontender and  nondistended. Normal bowel sounds, without guarding, and without rebound.   Neurologic:  Alert and  oriented x4;  grossly normal neurologically.  Impression/Plan: James Haas is here for an endoscopy to be performed for GERD and dyspepsia  Risks, benefits, limitations, and alternatives regarding  endoscopy have been reviewed with the patient.  Questions have been answered.  All parties agreeable.   James Lame, MD  02/16/2018, 10:04 AM

## 2018-02-16 NOTE — Transfer of Care (Signed)
Immediate Anesthesia Transfer of Care Note  Patient: James Haas  Procedure(s) Performed: ESOPHAGOGASTRODUODENOSCOPY (EGD) WITH PROPOFOL (N/A )  Patient Location: PACU  Anesthesia Type: General  Level of Consciousness: awake, alert  and patient cooperative  Airway and Oxygen Therapy: Patient Spontanous Breathing and Patient connected to supplemental oxygen  Post-op Assessment: Post-op Vital signs reviewed, Patient's Cardiovascular Status Stable, Respiratory Function Stable, Patent Airway and No signs of Nausea or vomiting  Post-op Vital Signs: Reviewed and stable  Complications: No apparent anesthesia complications

## 2018-02-16 NOTE — Anesthesia Postprocedure Evaluation (Signed)
Anesthesia Post Note  Patient: James Haas  Procedure(s) Performed: ESOPHAGOGASTRODUODENOSCOPY (EGD) WITH biopsy (N/A )  Patient location during evaluation: PACU Anesthesia Type: General Level of consciousness: awake and alert, oriented and patient cooperative Pain management: pain level controlled Vital Signs Assessment: post-procedure vital signs reviewed and stable Respiratory status: spontaneous breathing, nonlabored ventilation and respiratory function stable Cardiovascular status: blood pressure returned to baseline and stable Postop Assessment: adequate PO intake Anesthetic complications: no    Darrin Nipper

## 2018-02-19 ENCOUNTER — Encounter: Payer: Self-pay | Admitting: Gastroenterology

## 2019-10-09 DIAGNOSIS — R972 Elevated prostate specific antigen [PSA]: Secondary | ICD-10-CM | POA: Insufficient documentation

## 2020-06-17 ENCOUNTER — Other Ambulatory Visit: Payer: Self-pay

## 2020-06-17 ENCOUNTER — Ambulatory Visit (INDEPENDENT_AMBULATORY_CARE_PROVIDER_SITE_OTHER): Payer: Medicare HMO | Admitting: Urology

## 2020-06-17 ENCOUNTER — Encounter: Payer: Self-pay | Admitting: Urology

## 2020-06-17 VITALS — BP 148/81 | HR 76 | Ht 67.0 in | Wt 188.0 lb

## 2020-06-17 DIAGNOSIS — R972 Elevated prostate specific antigen [PSA]: Secondary | ICD-10-CM

## 2020-06-17 MED ORDER — DIAZEPAM 5 MG PO TABS: 5.0000 mg | ORAL_TABLET | Freq: Once | ORAL | 0 refills | Status: DC | PRN

## 2020-06-17 NOTE — Progress Notes (Signed)
   06/17/20 9:48 AM   Lynn Ito III 1950-10-19 [id number]  CC: Elevated PSA  HPI: I saw Mr. James Haas in urology clinic today for evaluation of elevated PSA.  He is a very healthy 69 year old male with no family history of prostate or bladder cancer who has had multiple mildly elevated PSAs over the last year.  His baseline PSA 2 years ago was ~2.  PSA was mildly elevated at 4.52 in January 2021, decreased to 3.95 in April 2021, and was elevated again in October 2021 at 5.69.  He has never undergone a prostate biopsy previously.  He denies any urinary symptoms or gross hematuria.  He denies any trouble with erections.   PMH: Past Medical History:  Diagnosis Date  . GERD (gastroesophageal reflux disease)   . Hyperlipemia   . Hypertension     Surgical History: Past Surgical History:  Procedure Laterality Date  . ESOPHAGOGASTRODUODENOSCOPY (EGD) WITH PROPOFOL N/A 02/16/2018   Procedure: ESOPHAGOGASTRODUODENOSCOPY (EGD) WITH biopsy;  Surgeon: Lucilla Lame, MD;  Location: [place] [place];  Service: Endoscopy;  Laterality: N/A;  prefers later appt  . TONSILLECTOMY     as child  . WISDOM TOOTH EXTRACTION      Family History: Family History  Problem Relation Age of Onset  . Bladder Cancer Neg Hx   . Kidney cancer Neg Hx   . Prostate cancer Neg Hx     Social History:  reports that he has never smoked. He has never used smokeless tobacco. He reports current alcohol use of about 10.0 standard drinks of alcohol per week. He reports that he does not use drugs.  Physical Exam: BP (!) 148/81   Pulse 76   Ht 5\' 7"  (1.702 m)   Wt 188 lb (85.3 kg)   BMI 29.44 kg/m    Constitutional:  Alert and oriented, No acute distress. Cardiovascular: No clubbing, cyanosis, or edema. Respiratory: Normal respiratory effort, no increased work of breathing. GI: Abdomen is soft, nontender, nondistended, no abdominal masses GU: Testicles 20 cc and descended bilaterally without masses, right 1 cm  epididymal cyst, patent meatus DRE: 50 g, smooth, no nodules or masses  Laboratory Data: PSA history reviewed, see HPI  Pertinent Imaging: None to review  Assessment & Plan:   69 year old healthy male with no family history of prostate cancer, benign DRE, and mildly elevated PSA of 5.69.  We reviewed the implications of an elevated PSA and the uncertainty surrounding it. In general, a man's PSA increases with age and is produced by both normal and cancerous prostate tissue. The differential diagnosis for elevated PSA includes BPH, prostate cancer, infection, recent intercourse/ejaculation, recent urethroscopic manipulation (foley placement/cystoscopy) or trauma, and prostatitis.   Management of an elevated PSA can include observation or prostate biopsy and we discussed this in detail. Our goal is to detect clinically significant prostate cancers, and manage with either active surveillance, surgery, or radiation for localized disease. Risks of prostate biopsy include bleeding, infection (including life threatening sepsis), pain, and lower urinary symptoms. Hematuria, hematospermia, and blood in the stool are all common after biopsy and can persist up to 4 weeks.   We discussed options including repeat PSA with reflex to free, prostate biopsy, or prostate MRI.  He would like to proceed with prostate biopsy.  Schedule prostate biopsy Valium sent to pharmacy  Nickolas Madrid, MD 06/17/2020  [facility] [address] [place], [place] [address] [phone]

## 2020-06-17 NOTE — Patient Instructions (Signed)

## 2020-06-23 ENCOUNTER — Other Ambulatory Visit: Payer: Self-pay | Admitting: Urology

## 2020-07-01 ENCOUNTER — Encounter: Payer: Self-pay | Admitting: Urology

## 2020-07-01 ENCOUNTER — Ambulatory Visit (INDEPENDENT_AMBULATORY_CARE_PROVIDER_SITE_OTHER): Payer: Medicare HMO | Admitting: Urology

## 2020-07-01 ENCOUNTER — Other Ambulatory Visit: Payer: Self-pay

## 2020-07-01 ENCOUNTER — Other Ambulatory Visit: Payer: Self-pay | Admitting: Urology

## 2020-07-01 VITALS — BP 162/93 | HR 82 | Ht 67.0 in | Wt 188.0 lb

## 2020-07-01 DIAGNOSIS — R972 Elevated prostate specific antigen [PSA]: Secondary | ICD-10-CM

## 2020-07-01 MED ORDER — LEVOFLOXACIN 500 MG PO TABS
500.0000 mg | ORAL_TABLET | Freq: Once | ORAL | Status: AC
  Administered 2020-07-01: 500 mg via ORAL

## 2020-07-01 MED ORDER — GENTAMICIN SULFATE 40 MG/ML IJ SOLN
80.0000 mg | Freq: Once | INTRAMUSCULAR | Status: AC
  Administered 2020-07-01: 80 mg via INTRAMUSCULAR

## 2020-07-01 NOTE — Addendum Note (Signed)
Addended by: Donalee Citrin on: 07/01/2020 01:17 PM   Modules accepted: Orders

## 2020-07-01 NOTE — Patient Instructions (Signed)

## 2020-07-01 NOTE — Progress Notes (Signed)
   07/01/20  Indication: Elevated PSA, 5.69  Prostate Biopsy Procedure   Informed consent was obtained, and we discussed the risks of bleeding and infection/sepsis. A time out was performed to ensure correct patient identity.  Pre-Procedure: - Last PSA Level: 5.69 - Gentamicin and levaquin given for antibiotic prophylaxis - Transrectal Ultrasound performed revealing a 37 gm prostate, PSA density 0.15 - Small to medium sized median lobe, see media tab  Procedure: - Prostate block performed using 10 cc 1% lidocaine and biopsies taken from sextant areas, a total of 12 under ultrasound guidance.  Post-Procedure: - Patient tolerated the procedure well - He was counseled to seek immediate medical attention if experiences significant bleeding, fevers, or severe pain - Return in one week to discuss biopsy results  Assessment/ Plan: Will follow up in 1-2 weeks to discuss pathology  Nickolas Madrid, MD 07/01/2020

## 2020-07-07 LAB — SURGICAL PATHOLOGY

## 2020-07-09 ENCOUNTER — Other Ambulatory Visit: Payer: Self-pay

## 2020-07-09 ENCOUNTER — Ambulatory Visit (INDEPENDENT_AMBULATORY_CARE_PROVIDER_SITE_OTHER): Payer: Medicare HMO | Admitting: Urology

## 2020-07-09 ENCOUNTER — Encounter: Payer: Self-pay | Admitting: Urology

## 2020-07-09 VITALS — BP 137/89 | Ht 67.0 in | Wt 185.0 lb

## 2020-07-09 DIAGNOSIS — R972 Elevated prostate specific antigen [PSA]: Secondary | ICD-10-CM | POA: Diagnosis not present

## 2020-07-09 DIAGNOSIS — C61 Malignant neoplasm of prostate: Secondary | ICD-10-CM

## 2020-07-09 NOTE — Progress Notes (Signed)
   07/09/2020 3:14 PM   James Haas [birth year] [id number]  Reason for visit: Discuss prostate biopsy results  HPI: I saw Mr. James Haas in clinic today to review his prostate biopsy results.  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  We had a lengthy conversation today about the patient's new diagnosis of prostate cancer.  We reviewed the risk classifications per the AUA guidelines including very low risk, low risk, intermediate risk, and high risk disease, and the need for additional staging imaging with CT and bone scan in patients with unfavorable intermediate risk and high risk disease.  I explained that his life expectancy, clinical stage, Gleason score, PSA, and other co-morbidities influence treatment strategies.  We discussed the roles of active surveillance, radiation therapy, surgical therapy with robotic prostatectomy, and hormone therapy with androgen deprivation.  We discussed that patients urinary symptoms also impact treatment strategy, as patients with severe lower urinary tract symptoms may have significant worsening or even develop urinary retention after undergoing radiation.  In regards to surgery, we discussed robotic prostatectomy +/- lymphadenectomy at length.  The procedure takes 3 to 4 hours, and patient's typically discharge home on post-op day #1.  A Foley catheter is left in place for 7 to 10 days to allow for healing of the vesicourethral anastomosis.  There is a small risk of bleeding, infection, damage to surrounding structures or bowel, hernia, DVT/PE, or serious cardiac or pulmonary complications.  We discussed at length post-op side effects including erectile dysfunction, and the importance of pre-operative erectile function on long-term  outcomes.  Even with a nerve sparing approach, there is an approximately 25% rate of permanent erectile dysfunction.  We also discussed postop urinary incontinence at length.  We expect patients to have stress incontinence post-operatively that will improve over period of weeks to months.  Less than 10% of men will require a pad at 1 year after surgery.  Patients will need to avoid heavy lifting and strenuous activity for 3 to 4 weeks, but most men return to their baseline activity status by 6 weeks.  In summary, James Haas is a 69 y.o. man with newly diagnosed low risk prostate cancer. He would like to pursue active surveillance.  With his higher volume disease and otherwise good health, I recommended obtaining an Oncotype DX GPS score to better risk stratify his prostate cancer moving forward with active surveillance.  We discussed that this can sometimes identify high risk patients that may benefit from upfront definitive management.  We will call with these results, or schedule follow-up to discuss further if concerning findings.  Oncotype DX GPS score RTC 6 months PSA prior  I spent 30 total minutes on the day of the encounter including pre-visit review of the medical record, face-to-face time with the patient, and post visit ordering of labs/imaging/tests.  Billey Co, [facility] [address], [place], [place] [address] [phone]

## 2020-07-14 ENCOUNTER — Telehealth: Payer: Self-pay

## 2020-07-14 NOTE — Telephone Encounter (Signed)
Per Dr. Diamantina Providence an order was placed through online portal for Oncotype test. Order #[id number]. Will contact patient when results are finalized

## 2020-07-27 ENCOUNTER — Other Ambulatory Visit: Payer: Self-pay | Admitting: Urology

## 2020-07-27 ENCOUNTER — Encounter: Payer: Self-pay | Admitting: Urology

## 2020-07-27 NOTE — Telephone Encounter (Signed)
Test results received, scanned into the chart

## 2020-07-28 ENCOUNTER — Encounter: Payer: Self-pay | Admitting: Urology

## 2020-11-24 ENCOUNTER — Other Ambulatory Visit: Payer: Self-pay | Admitting: *Deleted

## 2020-11-24 DIAGNOSIS — R972 Elevated prostate specific antigen [PSA]: Secondary | ICD-10-CM

## 2021-01-05 ENCOUNTER — Other Ambulatory Visit: Payer: Medicare HMO

## 2021-01-05 ENCOUNTER — Other Ambulatory Visit: Payer: Self-pay

## 2021-01-05 DIAGNOSIS — R972 Elevated prostate specific antigen [PSA]: Secondary | ICD-10-CM

## 2021-01-06 LAB — PSA: Prostate Specific Ag, Serum: 7.9 ng/mL — ABNORMAL HIGH (ref 0.0–4.0)

## 2021-01-07 ENCOUNTER — Encounter: Payer: Self-pay | Admitting: Urology

## 2021-01-07 ENCOUNTER — Ambulatory Visit: Payer: Medicare HMO | Admitting: Urology

## 2021-01-07 ENCOUNTER — Other Ambulatory Visit: Payer: Self-pay

## 2021-01-07 VITALS — BP 125/85 | HR 92 | Ht 68.0 in | Wt 187.0 lb

## 2021-01-07 DIAGNOSIS — C61 Malignant neoplasm of prostate: Secondary | ICD-10-CM | POA: Diagnosis not present

## 2021-01-07 DIAGNOSIS — R972 Elevated prostate specific antigen [PSA]: Secondary | ICD-10-CM

## 2021-01-07 NOTE — Progress Notes (Signed)
   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  With his high volume, we opted for a GPS score which was 25, and he opted for active surveillance.  Most recent PSA has increased to 7.9 from 5.69 at time of diagnosis.  He denies any change in any urinary symptoms.  We again had a long conversation about his low risk prostate cancer, and active surveillance protocols, as well as definitive treatment options with surgery or radiation.  Risks, benefits, and side effects including urinary symptoms, incontinence, and erectile dysfunction discussed extensively.  With his increase in the PSA, I recommended either a prostate MRI or a repeat prostate biopsy within the next few months.  He was amenable to a repeat prostate biopsy.  Risk of bleeding and infection discussed.  Repeat PSA in 3 months, call with results Repeat confirmatory prostate biopsy in 4 months, if PSA decreased significantly consider pushing back biopsy to 6 to 9 months   Billey Co, MD  [facility] [address] [place], [place] [address] [phone]

## 2021-01-07 NOTE — Patient Instructions (Signed)
Prostate Cancer  The prostate is a small gland (1.5 inches [3.8 cm] wide and 1 inch [2.5 cm] high) that is involved in the production of semen. It is located below a man's bladder, in front of the rectum. Prostate cancer is the abnormal growth of cells in the prostate gland. What are the causes? The exact cause of this condition is not known. What increases the risk? You are more likely to develop this condition if:  You are 68 years of age or older.  You are African American.  You have a family history of prostate cancer.  You have a family history of breast cancer. What are the signs or symptoms? Symptoms of this condition include:  A need to urinate often.  Weak or interrupted flow of urine.  Trouble starting or stopping urination.  Inability to urinate.  Blood in urine or semen.  Persistent pain or discomfort in the lower back, lower abdomen, hips, or upper thighs.  Trouble getting an erection.  Trouble emptying the bladder all the way. How is this diagnosed? This condition can be diagnosed with:  A digital rectal exam. For this exam, a health care provider inserts a gloved finger into the rectum to feel the prostate gland.  A blood test called a prostate-specific antigen (PSA) test.  A procedure in which a sample of tissue is taken from the prostate and checked under a microscope (prostate biopsy).  An imaging test called transrectal ultrasonography. Once the condition is diagnosed, tests will be done to determine how far the cancer has spread. This is called staging the cancer. Staging may involve imaging tests, such as:  A bone scan.  A CT scan.  A PET scan.  An MRI. The stages of prostate cancer are as follows:  Stage I. At this stage, the cancer is found in the prostate only. The cancer is not visible on imaging tests, and it is usually found by accident, such as during prostate surgery.  Stage II. At this stage, the cancer is more advanced than it is  in stage I, but the cancer has not spread outside the prostate.  Stage III. At this stage, the cancer has spread beyond the outer layer of the prostate to nearby tissues. The cancer may be found in the seminal vesicles, which are near the bladder and the prostate.  Stage IV. At this stage, the cancer has spread to other parts of the body, such as the lymph nodes, bones, bladder, rectum, liver, or lungs. How is this treated? Treatment for this condition depends on several factors, including the stage of the cancer, your age, personal preferences, and your overall health. Talk with your health care provider about treatment options that are recommended for you. Common treatments include:  Observation for early stage prostate cancer (active surveillance). This involves having exams, blood tests, and in some cases, more biopsies. For some men, this is the only treatment needed.  Surgery. Types of surgeries include: ? Open surgery (prostatectomy). In this surgery, a larger incision is made to remove the prostate. ? A laparoscopic prostatectomy. This is a surgery to remove the prostate and lymph nodes through several, small incisions. It is often referred to as a minimally invasive surgery. ? A robotic prostatectomy. This is laparoscopic surgery to remove the prostate and lymph nodes with the help of robotic arms that are controlled by the surgeon. ? Orchiectomy. This is surgery to remove the testicles. ? Cryosurgery. This is surgery to freeze and destroy cancer cells.  Radiation treatment. Types of radiation treatment include: ? External beam radiation. This type aims beams of radiation from outside the body at the prostate to destroy cancerous cells. ? Brachytherapy. This type uses radioactive needles, seeds, wires, or tubes that are implanted into the prostate gland. Like external beam radiation, brachytherapy destroys cancerous cells. An advantage is that this type of radiation limits the damage to  surrounding tissue and has fewer side effects.  High-intensity, focused ultrasonography. This treatment destroys cancer cells by delivering high-energy ultrasound waves to the cancerous cells.  Chemotherapy medicines. This treatment kills cancer cells or stops them from multiplying. It kills both cancer cells and normal cells.  Targeted therapy. This treatment uses medicines to kill cancer cells without damaging normal cells.  Hormone treatment. This treatment involves taking medicines that act on one of the male hormones (testosterone): ? By stopping your body from producing testosterone. ? By blocking testosterone from reaching cancer cells. Follow these instructions at home:  Take over-the-counter and prescription medicines only as told by your health care provider.  Maintain a healthy diet.  Get plenty of sleep.  Consider joining a support group for men who have prostate cancer. Meeting with a support group may help you learn to manage the stress of having cancer.  If you have to go to the hospital, notify your cancer specialist (oncologist).  Treatment for prostate cancer may affect sexual function. Continue to have intimate moments with your partner. This may include touching, holding, hugging, and caressing.  Keep all follow-up visits as told by your health care provider. This is important. Contact a health care provider if:  You have new or increasing trouble urinating.  You have new or increasing blood in your urine.  You have new or increasing pain in your hips, back, or chest. Get help right away if:  You have weakness or numbness in your legs.  You cannot control urination or your bowel movements (incontinence).  You have chills or a fever. Summary  The prostate is a small gland that is involved in the production of semen. It is located below a man's bladder, in front of the rectum.  Prostate cancer is the abnormal growth of cells in the prostate  gland.  Treatment for this condition depends on the stage of the cancer, your age, personal preferences, and your overall health. Talk with your health care provider about treatment options that are recommended for you.  Consider joining a support group for men who have prostate cancer. Meeting with a support group may help you learn to cope with the stress of having cancer. This information is not intended to replace advice given to you by your health care provider. Make sure you discuss any questions you have with your health care provider. Document Revised: 07/09/2019 Document Reviewed: 07/09/2019 Elsevier Patient Education  2021 [employer].

## 2021-04-08 ENCOUNTER — Other Ambulatory Visit: Payer: Medicare HMO

## 2021-04-08 ENCOUNTER — Other Ambulatory Visit: Payer: Self-pay

## 2021-04-08 DIAGNOSIS — C61 Malignant neoplasm of prostate: Secondary | ICD-10-CM

## 2021-04-09 ENCOUNTER — Other Ambulatory Visit: Payer: Self-pay

## 2021-04-09 LAB — PSA: Prostate Specific Ag, Serum: 7.8 ng/mL — ABNORMAL HIGH (ref 0.0–4.0)

## 2021-04-13 ENCOUNTER — Telehealth: Payer: Self-pay

## 2021-04-13 NOTE — Telephone Encounter (Signed)
-----   Message from Billey Co, MD sent at 04/13/2021  8:25 AM EDT ----- PSA is stable at 7.8 from 7.9, keep follow-up as scheduled for repeat biopsy  Nickolas Madrid, MD 04/13/2021

## 2021-04-13 NOTE — Telephone Encounter (Signed)
Called pt no answer. Left detailed message per DPR. Advised pt to call back for questions or concerns.  

## 2021-05-12 ENCOUNTER — Ambulatory Visit: Payer: Medicare HMO | Admitting: Urology

## 2021-05-12 ENCOUNTER — Other Ambulatory Visit: Payer: Self-pay

## 2021-05-12 VITALS — BP 129/89 | HR 81 | Ht 68.0 in | Wt 189.0 lb

## 2021-05-12 DIAGNOSIS — C61 Malignant neoplasm of prostate: Secondary | ICD-10-CM

## 2021-05-12 DIAGNOSIS — R972 Elevated prostate specific antigen [PSA]: Secondary | ICD-10-CM

## 2021-05-12 MED ORDER — GENTAMICIN SULFATE 40 MG/ML IJ SOLN
80.0000 mg | Freq: Once | INTRAMUSCULAR | Status: AC
  Administered 2021-05-12: 80 mg via INTRAMUSCULAR

## 2021-05-12 MED ORDER — LEVOFLOXACIN 500 MG PO TABS
500.0000 mg | ORAL_TABLET | Freq: Once | ORAL | Status: AC
  Administered 2021-05-12: 500 mg via ORAL

## 2021-05-12 NOTE — Progress Notes (Signed)
   05/12/21  Indication: Low risk prostate cancer on active surveillance  Prior biopsy in November 2021 showed 8/12 cores positive for Gleason score 3+3= 6 prostate adenocarcinoma with maximal involvement of 46%, GPS score was 25.  PSA is increased to 7.9 from 5.7 at time of diagnosis.  Here today for confirmatory biopsy  Prostate Biopsy Procedure   Informed consent was obtained, and we discussed the risks of bleeding and infection/sepsis. A time out was performed to ensure correct patient identity.  Pre-Procedure: - Last PSA Level: 7.8 - Gentamicin and levaquin given for antibiotic prophylaxis - Transrectal Ultrasound performed revealing a 55 gm prostate, PSA density 0.14 - Medium sized median lobe  Procedure: - Prostate block performed using 10 cc 1% lidocaine and biopsies taken from sextant areas, a total of 12 under ultrasound guidance.  Post-Procedure: - Patient tolerated the procedure well - He was counseled to seek immediate medical attention if experiences significant bleeding, fevers, or severe pain - Return in one week to discuss biopsy results  Assessment/ Plan: Will follow up in 1-2 weeks to discuss pathology  Nickolas Madrid, MD 05/12/2021

## 2021-05-12 NOTE — Patient Instructions (Signed)
Transrectal Ultrasound-Guided Prostate Biopsy, Care After This sheet gives you information about how to care for yourself after your procedure. Your doctor may also give you more specific instructions. If youhave problems or questions, contact your doctor. What can I expect after the procedure? After the procedure, it is common to have: Pain and discomfort in your butt, especially while sitting. Pink-colored pee (urine), due to small amounts of blood in the pee. Burning while peeing (urinating). Blood in your poop (stool). Bleeding from your butt. Blood in your semen. Follow these instructions at home: Medicines Take over-the-counter and prescription medicines only as told by your doctor. If you were prescribed antibiotic medicine, take it as told by your doctor. Do not stop taking the antibiotic even if you start to feel better. Activity  Do not drive for 24 hours if you were given a medicine to help you relax (sedative) during your procedure. Return to your normal activities as told by your doctor. Ask your doctor what activities are safe for you. Ask your doctor when it is okay for you to have sex. Do not lift anything that is heavier than 10 lb (4.5 kg), or the limit that you are told, until your doctor says that it is safe.  General instructions  Drink enough water to keep your pee pale yellow. Watch your pee, poop, and semen for new bleeding or bleeding that gets worse. Keep all follow-up visits as told by your doctor. This is important.  Contact a doctor if you: Have blood clots in your pee or poop. Notice that your pee smells bad or unusual. Have very bad belly pain. Have trouble peeing. Notice that your lower belly feels firm. Have blood in your pee for more than 2 weeks after the procedure. Have blood in your semen for more than 2 months after the procedure. Have problems getting an erection. Feel sick to your stomach (nauseous) or throw up (vomit). Have new or worse  bleeding in your pee, poop, or semen. Get help right away if you: Have a fever or chills. Have bright red pee. Have very bad pain that does not get better with medicine. Cannot pee. Summary After this procedure, it is common to have pain and discomfort around your butt, especially while sitting. You may have blood in your pee and poop. It is common to have blood in your semen for 1-2 months. If you were prescribed antibiotic medicine, take it as told by your doctor. Do not stop taking the antibiotic even if you start to feel better. Get help right away if you have a fever or chills. This information is not intended to replace advice given to you by your health care provider. Make sure you discuss any questions you have with your healthcare provider. Document Revised: 06/08/2020 Document Reviewed: 04/09/2020 Elsevier Patient Education  2022 Elsevier Inc.  

## 2021-05-13 LAB — SURGICAL PATHOLOGY

## 2021-05-17 DIAGNOSIS — Z23 Encounter for immunization: Secondary | ICD-10-CM | POA: Diagnosis not present

## 2021-05-20 ENCOUNTER — Other Ambulatory Visit: Payer: Self-pay

## 2021-05-20 ENCOUNTER — Ambulatory Visit: Payer: Medicare HMO | Admitting: Urology

## 2021-05-20 ENCOUNTER — Encounter: Payer: Self-pay | Admitting: Urology

## 2021-05-20 VITALS — BP 151/92 | HR 87 | Ht 68.0 in | Wt 189.0 lb

## 2021-05-20 DIAGNOSIS — R972 Elevated prostate specific antigen [PSA]: Secondary | ICD-10-CM | POA: Diagnosis not present

## 2021-05-20 DIAGNOSIS — C61 Malignant neoplasm of prostate: Secondary | ICD-10-CM

## 2021-05-20 NOTE — Progress Notes (Signed)
   05/20/2021 11:48 AM   James Haas [birth year] [id number]  Reason for visit: Low risk prostate cancer, discuss biopsy results  HPI: 70 year old male with low risk prostate cancer on active surveillance originally diagnosed in November 2021 for a PSA of 5.7.  He also had a GPS score which was 25.  PSA rose to 7.9 and a confirmatory biopsy was performed on 05/12/2021.  This showed a 55 g prostate with PSA density of 0.149 medium sized median lobe, and pathology showed 6/12 cores positive for Gleason score 3+3=6 prostate cancer with max core involvement of 45%, very similar to original biopsy.  Reviewed the AUA guidelines, I recommended continuing active surveillance for his low risk disease.  We discussed the very low, but nonzero, risk of developing metastatic disease while on surveillance, and a less than 25% chance for need for intervention in the future.  We reviewed active surveillance protocols, and he opted for follow-up PSA in 6 months, and if stable considering a yearly PSA/DRE.  We also discussed the potential role of prostate MRI if he continues to have a significant rise in the PSA  RTC 6 months PSA/DRE   Billey Co, MD  [facility] [address], [place], [place] [address] [phone]

## 2021-05-20 NOTE — Patient Instructions (Signed)
Prostate Cancer The prostate is a small gland that produces fluid that makes up semen (seminal fluid). It is located below the bladder in men, in front of the rectum. Prostate cancer is the abnormal growth of cells in the prostate gland. What are the causes? The exact cause of this condition is not known. What increases the risk? You are more likely to develop this condition if: You are 68 years of age or older. You have a family history of prostate cancer. You have a family history of breast and ovarian cancer. You have genes that are passed from parent to child (inherited), such as BRCA1 and BRCA2. You have Lynch syndrome. African American men and men of African descent are diagnosed with prostate cancer at higher rates than other men. The reasons for this are not well understood and are likely due to a combination of genetic and environmental factors. What are the signs or symptoms? Symptoms of this condition include: Problems with urination. This may include: A weak or interrupted flow of urine. Trouble starting or stopping urination. Trouble emptying the bladder all the way. The need to urinate more often, especially at night. Blood in urine or semen. Persistent pain or discomfort in the lower back, lower abdomen, or hips. Trouble getting an erection. Weakness or numbness in the legs or feet. How is this diagnosed? This condition can be diagnosed with: A digital rectal exam. For this exam, a health care provider inserts a gloved finger into the rectum to feel the prostate gland. A blood test called a prostate-specific antigen (PSA) test. A procedure in which a sample of tissue is taken from the prostate and checked under a microscope (prostate biopsy). An imaging test called transrectal ultrasonography. Once the condition is diagnosed, tests will be done to determine how far the cancer has spread. This is called staging the cancer. Staging may involve imaging tests, such as a bone  scan, CT scan, PET scan, or MRI. Stages of prostate cancer The stages of prostate cancer are as follows: Stage 1 (I). At this stage, the cancer is found in the prostate only. The cancer is not visible on imaging tests, and it is usually found by accident, such as during prostate surgery. Stage 2 (II). At this stage, the cancer is more advanced than it is in stage 1, but the cancer has not spread outside the prostate. Stage 3 (III). At this stage, the cancer has spread beyond the outer layer of the prostate to nearby tissues. The cancer may be found in the seminal vesicles, which are near the bladder and the prostate. Stage 4 (IV). At this stage, the cancer has spread to other parts of the body, such as the lymph nodes, bones, bladder, rectum, liver, or lungs. Prostate cancer grading Prostate cancer is also graded according to how the cancer cells look under a microscope. This is called the Gleason score and the total score can range from 6-10, indicating how likely it is that the cancer will spread (metastasize) to other parts of the body. The higher the score, the greater the likelihood that the cancer will spread. Gleason 6 or lower: This indicates that the cancer cells look similar to normal prostate cells (well differentiated). Gleason 7: This indicates that the cancer cells look somewhat similar to normal prostate cells (moderately differentiated). Gleason 8, 9, or 10: This indicates that the cancer cells look very different than normal prostate cells (poorly differentiated). How is this treated? Treatment for this condition depends on several factors,  including the stage of the cancer, your age, personal preferences, and your overall health. Talk with your health care provider about treatment options that are recommended for you. Common treatments include: Observation for early stage prostate cancer (active surveillance). This involves having exams, blood tests, and in some cases, more biopsies.  For some men, this is the only treatment needed. Surgery. Types of surgeries include: Open surgery (radical prostatectomy). In this surgery, a larger incision is made to remove the prostate. A laparoscopic radical prostatectomy. This is a surgery to remove the prostate and lymph nodes through several small incisions. It is often referred to as a minimally invasive surgery. A robotic radical prostatectomy. This is laparoscopic surgery to remove the prostate and lymph nodes with the help of robotic arms that are controlled by the surgeon. Cryoablation. This is surgery to freeze and destroy cancer cells. Radiation treatment. Types of radiation treatment include: External beam radiation. This type aims beams of radiation from outside the body at the prostate to destroy cancerous cells. Brachytherapy. This type uses radioactive needles, seeds, wires, or tubes that are implanted into the prostate gland. Like external beam radiation, brachytherapy destroys cancerous cells. An advantage is that this type of radiation limits the damage to surrounding tissue and has fewer side effects. Chemotherapy. This treatment kills cancer cells or stops them from multiplying. It kills both cancer cells and normal cells. Targeted therapy. This treatment uses medicines to kill cancer cells without damaging normal cells. Hormone treatment. This treatment involves taking medicines that act on testosterone, one of the male hormones, by: Stopping your body from producing testosterone. Blocking testosterone from reaching cancer cells. Follow these instructions at home: Lifestyle Do not use any products that contain nicotine or tobacco. These products include cigarettes, chewing tobacco, and vaping devices, such as e-cigarettes. If you need help quitting, ask your health care provider. Eat a healthy diet. To do this: Eat foods that are high in fiber. These include beans, whole grains, and fresh fruits and vegetables. Limit  foods that are high in fat and sugar. These include fried or sweet foods. Treatment for prostate cancer may affect sexual function. If you have a partner, continue to have intimate moments. This may include touching, holding, hugging, and caressing your partner. Get plenty of sleep. Consider joining a support group for men who have prostate cancer. Meeting with a support group may help you learn to manage the stress of having cancer. General instructions Take over-the-counter and prescription medicines only as told by your health care provider. If you have to go to the hospital, notify your cancer specialist (oncologist). Keep all follow-up visits. This is important. Where to find more information American Cancer Society: www.cancer.org American Society of Clinical Oncology: www.cancer.net National Cancer Institute: www.cancer.gov Contact a health care provider if: You have new or increasing trouble urinating. You have new or increasing blood in your urine. You have new or increasing pain in your hips, back, or chest. Get help right away if: You have weakness or numbness in your legs. You cannot control urination or your bowel movements (incontinence). You have chills or a fever. Summary The prostate is a small gland that is involved in the production of semen. It is located below a man's bladder, in front of the rectum. Prostate cancer is the abnormal growth of cells in the prostate gland. Treatment for this condition depends on the stage of the cancer, your age, personal preferences, and your overall health. Talk with your health care provider about treatment   options that are recommended for you. Consider joining a support group for men who have prostate cancer. Meeting with a support group may help you learn to manage the stress of having cancer. This information is not intended to replace advice given to you by your health care provider. Make sure you discuss any questions you have with  your health care provider. Document Revised: 10/21/2020 Document Reviewed: 10/21/2020 Elsevier Patient Education  2022 Elsevier Inc.  

## 2021-11-18 ENCOUNTER — Other Ambulatory Visit: Payer: Medicare HMO

## 2021-11-18 DIAGNOSIS — R972 Elevated prostate specific antigen [PSA]: Secondary | ICD-10-CM

## 2021-11-19 LAB — PSA: Prostate Specific Ag, Serum: 7.9 ng/mL — ABNORMAL HIGH (ref 0.0–4.0)

## 2021-11-25 ENCOUNTER — Ambulatory Visit: Payer: Medicare HMO | Admitting: Urology

## 2021-11-25 ENCOUNTER — Encounter: Payer: Self-pay | Admitting: Urology

## 2021-11-25 VITALS — BP 125/84 | HR 94 | Ht 68.0 in | Wt 188.0 lb

## 2021-11-25 DIAGNOSIS — R972 Elevated prostate specific antigen [PSA]: Secondary | ICD-10-CM

## 2021-11-25 DIAGNOSIS — C61 Malignant neoplasm of prostate: Secondary | ICD-10-CM | POA: Diagnosis not present

## 2021-11-25 DIAGNOSIS — R399 Unspecified symptoms and signs involving the genitourinary system: Secondary | ICD-10-CM

## 2021-11-25 DIAGNOSIS — R3912 Poor urinary stream: Secondary | ICD-10-CM | POA: Diagnosis not present

## 2021-11-25 MED ORDER — TAMSULOSIN HCL 0.4 MG PO CAPS: 0.4000 mg | ORAL_CAPSULE | Freq: Every day | ORAL | 11 refills | Status: DC

## 2021-11-25 NOTE — Progress Notes (Signed)
? ?  11/25/2021 ?12:37 PM  ? ?RAWLIN REAUME III ?[birth year] ?[id number] ? ?Reason for visit: Low risk prostate cancer on active surveillance, lower urinary tract symptoms ? ?HPI: ?71 year old male with low risk prostate cancer on active surveillance originally diagnosed in November 2021 for a PSA of 5.7.  He also had a GPS score which was 25.  PSA rose to 7.9 and a confirmatory biopsy was performed on 05/12/2021.  This showed a 55 g prostate with PSA density of 0.149 medium sized median lobe, and pathology showed 6/12 cores positive for Gleason score 3+3=6 prostate cancer with max core involvement of 45%, very similar to original biopsy.  He opted to continue active surveillance at that time. ? ?Most recent PSA is stable at 7.9 from 7.9 last year. ? ?DRE today 50 g, smooth prostate, no nodules or masses. ? ?Reviewed the AUA guidelines, I recommended continuing active surveillance for his low risk disease.  We discussed the very low, but nonzero, risk of developing metastatic disease while on surveillance, and a less than 25% chance for need for intervention in the future.  We reviewed active surveillance protocols, and he opted for 1 year follow-up with PSA/DRE.  We also discussed the potential role of prostate MRI if he continues to have a significant rise in the PSA. ? ?He has had some worsening urinary symptoms of some weak stream and flow, and is interested in trying medication at this point.  Risk and benefits of Flomax discussed at length, and trial of 0.4 mg nightly sent in.  Risk and benefits discussed ? ?Trial of Flomax 0.4 mg nightly for urinary symptoms ?RTC 1 year PSA/DRE ? ? ?Billey Co, MD ? ?[facility] ?[address], [address] ?[place], [place] [address] ?[phone] ? ?

## 2022-11-18 ENCOUNTER — Other Ambulatory Visit: Payer: Self-pay | Admitting: Urology

## 2022-11-21 ENCOUNTER — Ambulatory Visit: Payer: Medicare HMO

## 2022-11-21 DIAGNOSIS — R972 Elevated prostate specific antigen [PSA]: Secondary | ICD-10-CM

## 2022-11-22 LAB — PSA: Prostate Specific Ag, Serum: 11.4 ng/mL — ABNORMAL HIGH (ref 0.0–4.0)

## 2022-11-24 ENCOUNTER — Encounter: Payer: Self-pay | Admitting: Urology

## 2022-11-24 ENCOUNTER — Ambulatory Visit (INDEPENDENT_AMBULATORY_CARE_PROVIDER_SITE_OTHER): Payer: Medicare HMO | Admitting: Urology

## 2022-11-24 VITALS — BP 125/81 | HR 79 | Ht 68.0 in | Wt 189.0 lb

## 2022-11-24 DIAGNOSIS — C61 Malignant neoplasm of prostate: Secondary | ICD-10-CM | POA: Diagnosis not present

## 2022-11-24 DIAGNOSIS — R3912 Poor urinary stream: Secondary | ICD-10-CM | POA: Diagnosis not present

## 2022-11-24 DIAGNOSIS — R399 Unspecified symptoms and signs involving the genitourinary system: Secondary | ICD-10-CM

## 2022-11-24 DIAGNOSIS — R972 Elevated prostate specific antigen [PSA]: Secondary | ICD-10-CM | POA: Diagnosis not present

## 2022-11-24 MED ORDER — TAMSULOSIN HCL 0.4 MG PO CAPS: 0.4000 mg | ORAL_CAPSULE | Freq: Every day | ORAL | 3 refills | Status: DC

## 2022-11-24 NOTE — Patient Instructions (Signed)
Will repeat PSA in 1 month, if back to your baseline of approximately 8, we will continue to monitor the PSA.  However, if the PSA remains elevated >10 I would recommend a prostate MRI, and considering a repeat prostate biopsy targeting any abnormal areas of seen on the MRI

## 2022-11-24 NOTE — Progress Notes (Signed)
   11/24/2022 9:40 AM   James Haas [birth year] [id number]  Reason for visit: Low risk prostate cancer on active surveillance, lower urinary tract symptoms  HPI: 72 year old male with low risk prostate cancer on active surveillance originally diagnosed in November 2021 for a PSA of 5.7.  He also had a GPS score which was 25.  PSA rose to 7.9 and a confirmatory biopsy was performed on 05/12/2021.  This showed a 55 g prostate with PSA density of 0.15 medium sized median lobe, and pathology showed 6/12 cores positive for Gleason score 3+3=6 prostate cancer with max core involvement of 45%, very similar to original biopsy.  He opted to continue active surveillance at that time.  DRE has been benign.  At our visit in April 2023 he reported some mild to moderate urinary symptoms of weak stream and was interested in a trial of medications.  He has been on Flomax 0.4 mg nightly since that time with significant improvement in the urinary symptoms, he denies any urinary complaints today and would like to continue the Flomax.  Most recent PSA 11/21/2022 increased to 11.4 from 7.9 11/2021, and 7.8 in 2022.  We reviewed options with his bump in the PSA including repeat PSA in 1 month, prostate MRI, or repeat biopsy.  Using shared decision making he opted for repeat PSA in 1 month.  -Repeat PSA 1 month, if back to baseline of ~8 will continue active surveillance with repeat PSA in 6 months.  If PSA remains elevated >10 will pursue prostate MRI, and he understands need for fusion biopsy if any abnormal areas seen on MRI -Flomax refilled   Sondra Come, MD  [facility] [address], [address] [place], [place] [address] [phone]

## 2022-12-22 ENCOUNTER — Other Ambulatory Visit: Payer: Medicare HMO

## 2022-12-22 DIAGNOSIS — R972 Elevated prostate specific antigen [PSA]: Secondary | ICD-10-CM

## 2022-12-23 ENCOUNTER — Telehealth: Payer: Self-pay | Admitting: Urology

## 2022-12-23 ENCOUNTER — Telehealth: Payer: Self-pay

## 2022-12-23 DIAGNOSIS — R972 Elevated prostate specific antigen [PSA]: Secondary | ICD-10-CM

## 2022-12-23 LAB — PSA: Prostate Specific Ag, Serum: 16.6 ng/mL — ABNORMAL HIGH (ref 0.0–4.0)

## 2022-12-23 NOTE — Telephone Encounter (Signed)
Called pt informed him of the information below per Dr. Richardo Hanks on MRI. Pt voiced understanding. MRI ordered.

## 2022-12-23 NOTE — Telephone Encounter (Signed)
Patient called and lvm that he is unable to access his mychart to view his lab results. He requests a call to get results.

## 2022-12-23 NOTE — Telephone Encounter (Signed)
See telephone encounter from 12/23/22.

## 2022-12-23 NOTE — Telephone Encounter (Signed)
-----   Message from Sondra Come, MD sent at 12/23/2022 11:47 AM EDT ----- PSA remains elevated, recommend prostate MRI as discussed in clinic, will call w MRI results  Legrand Rams, MD 12/23/2022

## 2023-01-05 ENCOUNTER — Ambulatory Visit
Admission: RE | Admit: 2023-01-05 | Discharge: 2023-01-05 | Disposition: A | Discharge status: Home / Self Care | Payer: Medicare HMO | Source: Ambulatory Visit | Attending: Urology | Admitting: Urology

## 2023-01-05 DIAGNOSIS — R972 Elevated prostate specific antigen [PSA]: Secondary | ICD-10-CM | POA: Diagnosis not present

## 2023-01-05 MED ORDER — GADOBUTROL 1 MMOL/ML IV SOLN
8.0000 mL | Freq: Once | INTRAVENOUS | Status: AC | PRN
  Administered 2023-01-05: 8 mL via INTRAVENOUS

## 2023-01-06 ENCOUNTER — Telehealth: Payer: Self-pay

## 2023-01-06 NOTE — Telephone Encounter (Signed)
Call received on triage line regarding MRI fusion biopsy results.  Pt instructed his provider will review his results and we sill contact him.  Pt verbalized understanding.

## 2023-01-11 ENCOUNTER — Telehealth: Payer: Self-pay

## 2023-01-11 NOTE — Telephone Encounter (Signed)
-----   Message from Sondra Come, MD sent at 01/10/2023 12:54 PM EDT ----- Prostate MRI does show a few abnormal areas.  He has low risk disease on active surveillance at this time, would recommend MRI fusion biopsy to confirm no areas of more aggressive prostate cancer.  Please schedule fusion biopsy and review instructions  Legrand Rams, MD 01/10/2023

## 2023-01-11 NOTE — Telephone Encounter (Signed)
Called pt informed him of the information below. Pt voiced understanding. No blood thinners. Pt advised to d/c ASA 81mg  7 days prior. Pt voiced understanding on enema.

## 2023-03-08 ENCOUNTER — Encounter: Payer: Self-pay | Admitting: Urology

## 2023-03-08 ENCOUNTER — Ambulatory Visit: Payer: Medicare HMO | Admitting: Urology

## 2023-03-08 VITALS — BP 147/93 | HR 81

## 2023-03-08 DIAGNOSIS — N4232 Atypical small acinar proliferation of prostate: Secondary | ICD-10-CM

## 2023-03-08 DIAGNOSIS — Z2989 Encounter for other specified prophylactic measures: Secondary | ICD-10-CM

## 2023-03-08 DIAGNOSIS — R972 Elevated prostate specific antigen [PSA]: Secondary | ICD-10-CM

## 2023-03-08 DIAGNOSIS — C61 Malignant neoplasm of prostate: Secondary | ICD-10-CM | POA: Diagnosis not present

## 2023-03-08 MED ORDER — LEVOFLOXACIN 500 MG PO TABS
500.0000 mg | ORAL_TABLET | Freq: Once | ORAL | Status: AC
Start: 2023-03-08 — End: 2023-03-08
  Administered 2023-03-08: 500 mg via ORAL

## 2023-03-08 MED ORDER — GENTAMICIN SULFATE 40 MG/ML IJ SOLN
80.0000 mg | Freq: Once | INTRAMUSCULAR | Status: AC
Start: 2023-03-08 — End: 2023-03-08
  Administered 2023-03-08: 80 mg via INTRAMUSCULAR

## 2023-03-08 NOTE — Patient Instructions (Signed)

## 2023-03-08 NOTE — Progress Notes (Signed)
   03/08/23  Indication: Low risk prostate cancer on active surveillance, rising PSA 16.6, abnormal prostate MRI  MRI Fusion Prostate Biopsy Procedure   Informed consent was obtained, and we discussed the risks of bleeding and infection/sepsis. A time out was performed to ensure correct patient identity.  Pre-Procedure: - Last PSA Level: 16.6 - Gentamicin and levaquin given for antibiotic prophylaxis -Prostate measured 46 g on MRI, PSA density 0.36 -Moderate sized median lobe  Procedure: - Prostate block performed using 10 cc 1% lidocaine  - MRI fusion biopsy was performed, and biopsies were taken from:  ROI#1 PIRADS 4 lesion left anterior transition zone mid gland/apex(3) ROI#2 PI-RADS 3 lesion right peripheral zone mid gland(3)  - Standard biopsies taken from sextant areas, 12 under ultrasound guidance. - Total of 18 cores taken  Post-Procedure: - Patient tolerated the procedure well - He was counseled to seek immediate medical attention if experiences significant bleeding, fevers, or severe pain - Return in one week to discuss biopsy results  Assessment/ Plan: Will follow up in 1-2 weeks to discuss pathology   Legrand Rams, MD 03/08/2023

## 2023-03-16 ENCOUNTER — Encounter: Payer: Self-pay | Admitting: Urology

## 2023-03-16 ENCOUNTER — Ambulatory Visit: Payer: Medicare HMO | Admitting: Urology

## 2023-03-16 VITALS — BP 142/91 | HR 88 | Ht 68.0 in | Wt 185.0 lb

## 2023-03-16 DIAGNOSIS — C61 Malignant neoplasm of prostate: Secondary | ICD-10-CM

## 2023-03-16 NOTE — Patient Instructions (Signed)
Robot-Assisted Laparoscopic Radical Prostatectomy  Robot-assisted laparoscopic radical prostatectomy is surgery done to remove the entire prostate and nearby tissue. This includes the seminal vesicles, which are near the bladder and the prostate. This procedure is done to treat prostate cancer that has not spread (metastasized) to other parts of the body. The goal of the surgery is to remove all cancer cells to help keep the cancer from metastasizing. During this procedure, the surgeon makes several incisions in the abdomen instead of one large incision. A long, thin, lighted tube with a tiny camera on the end (laparoscope) is put into one of the incisions. This allows the surgeon to see inside the abdomen. Other surgical tools are put in through the other incisions and used to take out the prostate and nearby tissues. The surgeon uses robotic arms to control these tools while sitting at a computer near the operating table. Lymph nodes in the pelvis may also be removed. Lymph nodes are part of the body's disease-fighting system (immune system). When prostate cancer spreads, it tends to go to the lymph nodes in the pelvis first. If the pelvic lymph nodes are removed, they will be checked for cancer cells. Tell a health care provider about: Any allergies you have. All medicines you are taking, including vitamins, herbs, eye drops, creams, and over-the-counter medicines. Any problems you or family members have had with anesthetic medicines. Any bleeding problems you have. Any surgeries you have had. Any medical conditions you have. Any prostate infections you have had. What are the risks? Generally, this is a safe procedure. Still, problems may occur, including: Infection. Bleeding. Allergic reactions to medicines. Damage to nearby structures or organs, such as the rectum, ureters, urethra, bladder, or small intestine. Blockage (obstruction) of the large or small intestines. Problems that affect  urination or sexual function. These may include: Narrowing or scarring of the urethra (stricture), which may block the flow of urine. Inability to control when you urinate (incontinence). Inability to get or keep an erection (erectile dysfunction). Dry ejaculation. This is when no semen comes out during orgasm. The formation of a sac (cyst) in the pelvis that is filled with fluid from the lymph glands (lymphocele). Blood clots in the legs. What happens before the procedure? Staying hydrated Follow instructions from your health care provider about hydration, which may include: Up to 2 hours before the procedure - you may continue to drink clear liquids, such as water, clear fruit juice, black coffee, and plain tea.  Eating and drinking restrictions Follow instructions from your health care provider about eating and drinking, which may include: 8 hours before the procedure - stop eating heavy meals or foods, such as meat, fried foods, or fatty foods. 6 hours before the procedure - stop eating light meals or foods, such as toast or cereal. 6 hours before the procedure - stop drinking milk or drinks that contain milk. 2 hours before the procedure - stop drinking clear liquids. Medicines Ask your health care provider about: Changing or stopping your regular medicines. This is especially important if you are taking diabetes medicines or blood thinners. Taking medicines such as aspirin and ibuprofen. These medicines can thin your blood. Do not take these medicines unless your health care provider tells you to take them. Taking over-the-counter medicines, vitamins, herbs, and supplements. Follow your health care provider's instructions about cleaning out your bowels. Surgery safety Ask your health care provider: How your surgery site will be marked. What steps will be taken to help prevent infection.  These steps may include: Removing hair at the surgery site. Washing skin with a germ-killing  soap. Taking antibiotic medicine. General instructions Do not use any products that contain nicotine or tobacco for at least 4 weeks before the procedure. These products include cigarettes, chewing tobacco, and vaping devices, such as e-cigarettes. If you need help quitting, ask your health care provider. Plan to have a responsible adult take you home from the hospital or clinic. Plan to have a responsible adult care for you for the time you are told after you leave the hospital or clinic. You may have an exam or testing. This may include blood or urine samples, or imaging tests such as a CT scan or an MRI. What happens during the procedure? An IV will be put into a vein in your hand or arm. You may be given: A medicine to help you relax (sedative). A medicine to make you fall asleep (general anesthetic). A thin, flexible tube (Foley catheter) will be put into your penis through your urethra and into your bladder to drain your urine. Small incisions will be made in your abdomen and near your belly button. The laparoscope and other surgical instruments will be put through the incisions. The surgical tools will be used to cut and remove your prostate, seminal vesicles, and maybe your pelvic lymph nodes. Your surgeon will use a computer and robotic arms to control the surgical instruments. Your urethra will be cut and separated from your bladder to take out the prostate. Your urethra will then be reconnected to your bladder neck. This is the group of muscles that help push urine through your urethra. A small tube (drain) may be put in one or more of your incisions to help drain extra fluid from your surgical site after surgery. The laparoscope and other surgical instruments will be removed. Your incisions will be closed with stitches (sutures), skin glue, or adhesive strips. Medicine may be applied and bandages (dressings) will be placed over your incisions. The procedure may vary among health care  providers and hospitals. What happens after the procedure? Your blood pressure, heart rate, breathing rate, and blood oxygen level will be monitored until you leave the hospital or clinic. You may get fluids and medicines through your IV. You may be given antibiotics and medicines to help relieve pain or nausea. You will be encouraged to walk as soon as possible. You will also use a device or do breathing exercises to keep your lungs clear. The catheter will stay in to drain urine from your bladder. You will be taught how to care for it at home. The drain may stay in to drain fluid from the surgical site. If so, you will be taught how to care for it at home. You may need to wear compression stockings until you are able to get up and walk around. These stockings help prevent blood clots and reduce swelling in your legs. If you were given a sedative during the procedure, it can affect you for several hours. Do not drive or operate machinery until your health care provider says that it is safe. Summary Robot-assisted laparoscopic radical prostatectomy is a surgical procedure to remove the entire prostate and the seminal vesicles. Follow instructions from your health care provider about eating and drinking before your surgery. After your procedure, you may be given fluids and medicines through an IV. You may get antibiotics and medicines to help relieve pain or nausea. After your surgery, you will continue to have a small,  thin tube (Foley catheter) draining your urine. You will be taught how to care for it at home. This information is not intended to replace advice given to you by your health care provider. Make sure you discuss any questions you have with your health care provider. Document Revised: 10/21/2020 Document Reviewed: 10/21/2020 Elsevier Patient Education  2024 [employer]  Prostate Cancer  The prostate is a small gland that produces fluid that makes up semen (seminal fluid). It is  located below the bladder in men, in front of the rectum. Prostate cancer is the abnormal growth of cells in the prostate gland. What are the causes? The exact cause of this condition is not known. What increases the risk? You are more likely to develop this condition if: You are 59 years of age or older. You have a family history of prostate cancer. You have a family history of breast and ovarian cancer. You have genes that are passed from parent to child (inherited), such as BRCA1 and BRCA2. You have Lynch syndrome. African American men and men of African descent are diagnosed with prostate cancer at higher rates than other men. The reasons for this are not well understood and are likely due to a combination of genetic and environmental factors. What are the signs or symptoms? Symptoms of this condition include: Problems with urination. This may include: A weak or interrupted flow of urine. Trouble starting or stopping urination. Trouble emptying the bladder all the way. The need to urinate more often, especially at night. Blood in urine or semen. Persistent pain or discomfort in the lower back, lower abdomen, or hips. Trouble getting an erection. Weakness or numbness in the legs or feet. How is this diagnosed? This condition can be diagnosed with: A digital rectal exam. For this exam, a health care provider inserts a gloved finger into the rectum to feel the prostate gland. A blood test called a prostate-specific antigen (PSA) test. A procedure in which a sample of tissue is taken from the prostate and checked under a microscope (prostate biopsy). An imaging test called transrectal ultrasonography. Once the condition is diagnosed, tests will be done to determine how far the cancer has spread. This is called staging the cancer. Staging may involve imaging tests, such as a bone scan, CT scan, PET scan, or MRI. Stages of prostate cancer The stages of prostate cancer are as  follows: Stage 1 (I). At this stage, the cancer is found in the prostate only. The cancer is not visible on imaging tests, and it is usually found by accident, such as during prostate surgery. Stage 2 (II). At this stage, the cancer is more advanced than it is in stage 1, but the cancer has not spread outside the prostate. Stage 3 (III). At this stage, the cancer has spread beyond the outer layer of the prostate to nearby tissues. The cancer may be found in the seminal vesicles, which are near the bladder and the prostate. Stage 4 (IV). At this stage, the cancer has spread to other parts of the body, such as the lymph nodes, bones, bladder, rectum, liver, or lungs. Prostate cancer grading Prostate cancer is also graded according to how the cancer cells look under a microscope. This is called the Gleason score and the total score can range from 6-10, indicating how likely it is that the cancer will spread (metastasize) to other parts of the body. The higher the score, the greater the likelihood that the cancer will spread. Gleason 6 or lower:  This indicates that the cancer cells look similar to normal prostate cells (well differentiated). Gleason 7: This indicates that the cancer cells look somewhat similar to normal prostate cells (moderately differentiated). Gleason 8, 9, or 10: This indicates that the cancer cells look very different than normal prostate cells (poorly differentiated). How is this treated? Treatment for this condition depends on several factors, including the stage of the cancer, your age, personal preferences, and your overall health. Talk with your health care provider about treatment options that are recommended for you. Common treatments include: Observation for early stage prostate cancer (active surveillance). This involves having exams, blood tests, and in some cases, more biopsies. For some men, this is the only treatment needed. Surgery. Types of surgeries include: Open  surgery (radical prostatectomy). In this surgery, a larger incision is made to remove the prostate. A laparoscopic radical prostatectomy. This is a surgery to remove the prostate and lymph nodes through several small incisions. It is often referred to as a minimally invasive surgery. A robotic radical prostatectomy. This is laparoscopic surgery to remove the prostate and lymph nodes with the help of robotic arms that are controlled by the surgeon. Cryoablation. This is surgery to freeze and destroy cancer cells. Radiation treatment. Types of radiation treatment include: External beam radiation. This type aims beams of radiation from outside the body at the prostate to destroy cancerous cells. Brachytherapy. This type uses radioactive needles, seeds, wires, or tubes that are implanted into the prostate gland. Like external beam radiation, brachytherapy destroys cancerous cells. An advantage is that this type of radiation limits the damage to surrounding tissue and has fewer side effects. Chemotherapy. This treatment kills cancer cells or stops them from multiplying. It kills both cancer cells and normal cells. Targeted therapy. This treatment uses medicines to kill cancer cells without damaging normal cells. Hormone treatment. This treatment involves taking medicines that act on testosterone, one of the male hormones, by: Stopping your body from producing testosterone. Blocking testosterone from reaching cancer cells. Follow these instructions at home: Lifestyle Do not use any products that contain nicotine or tobacco. These products include cigarettes, chewing tobacco, and vaping devices, such as e-cigarettes. If you need help quitting, ask your health care provider. Eat a healthy diet. To do this: Eat foods that are high in fiber. These include beans, whole grains, and fresh fruits and vegetables. Limit foods that are high in fat and sugar. These include fried or sweet foods. Treatment for  prostate cancer may affect sexual function. If you have a partner, continue to have intimate moments. This may include touching, holding, hugging, and caressing your partner. Get plenty of sleep. Consider joining a support group for men who have prostate cancer. Meeting with a support group may help you learn to manage the stress of having cancer. General instructions Take over-the-counter and prescription medicines only as told by your health care provider. If you have to go to the hospital, notify your cancer specialist (oncologist). Keep all follow-up visits. This is important. Where to find more information American Cancer Society: www.cancer.org American Society of Clinical Oncology: www.cancer.net [employer]: www.cancer.gov Contact a health care provider if: You have new or increasing trouble urinating. You have new or increasing blood in your urine. You have new or increasing pain in your hips, back, or chest. Get help right away if: You have weakness or numbness in your legs. You cannot control urination or your bowel movements (incontinence). You have chills or a fever. Summary The prostate  is a small gland that is involved in the production of semen. It is located below a man's bladder, in front of the rectum. Prostate cancer is the abnormal growth of cells in the prostate gland. Treatment for this condition depends on the stage of the cancer, your age, personal preferences, and your overall health. Talk with your health care provider about treatment options that are recommended for you. Consider joining a support group for men who have prostate cancer. Meeting with a support group may help you learn to manage the stress of having cancer. This information is not intended to replace advice given to you by your health care provider. Make sure you discuss any questions you have with your health care provider. Document Revised: 10/21/2020 Document Reviewed:  10/21/2020 Elsevier Patient Education  2024 [employer].

## 2023-03-16 NOTE — Progress Notes (Signed)
03/16/2023 8:47 AM   James Haas Mar 24, 1951 [id number]  Reason for visit: Prostate cancer, review biopsy results  HPI: Healthy 72 year old male originally diagnosed with low risk prostate cancer in November 2021 for PSA of 5.7, PSA has risen over the last year up to 16.6, and prostate MRI showed a 45 g prostate(PSA density 0.36), with PI-RADS 4 lesion in the left transition zone, and PI-RADS 3 lesion in the right peripheral zone.  He underwent an MRI fusion biopsy on 03/08/2023.  Biopsy showed Gleason score 3+4=7 disease in the PI-RADS 4 ROI, as well as an additional core of 3+4=7 in the left lateral prostate, and 2 additional cores of Gleason score 3+3=6.  He has a moderate-sized median lobe on MRI.  He denies any problems with erections.  Takes Flomax for weak stream.  We had a lengthy conversation today about the patient's new diagnosis of prostate cancer.  We reviewed the risk classifications per the AUA guidelines including very low risk, low risk, intermediate risk, and high risk disease, and the need for additional staging imaging with CT and bone scan in patients with unfavorable intermediate risk and high risk disease.  I explained that his life expectancy, clinical stage, Gleason score, PSA, and other co-morbidities influence treatment strategies.  We discussed the roles of active surveillance, radiation therapy, surgical therapy with robotic prostatectomy, and hormone therapy with androgen deprivation.  We discussed that patients urinary symptoms also impact treatment strategy, as patients with severe lower urinary tract symptoms may have significant worsening or even develop urinary retention after undergoing radiation.  In regards to surgery, we discussed robotic prostatectomy +/- lymphadenectomy at length.  The procedure takes 3 to 4 hours, and patient's typically discharge home on post-op day #1.  A Foley catheter is left in place for 7 to 10 days to allow for healing of the  vesicourethral anastomosis.  There is a small risk of bleeding, infection, damage to surrounding structures or bowel, hernia, DVT/PE, or serious cardiac or pulmonary complications.  We discussed at length post-op side effects including erectile dysfunction, and the importance of pre-operative erectile function on long-term outcomes.  Even with a nerve sparing approach, there is an approximately 25% rate of permanent erectile dysfunction.  We also discussed postop urinary incontinence at length.  We expect patients to have stress incontinence post-operatively that will improve over period of weeks to months.  Less than 10% of men will require a pad at 1 year after surgery.  Patients will need to avoid heavy lifting and strenuous activity for 3 to 4 weeks, but most men return to their baseline activity status by 6 weeks.  In summary, James Haas is a 72 y.o. man with history of low risk prostate cancer originally diagnosed in 2021, persistently rising PSA up to 16.6 with PI-RADS 4 lesion on MRI, and MRI fusion biopsy confirming change of disease to Gleason score 3+4=7 favorable intermediate risk disease.  I think with his persistently rising PSA, change to favorable intermediate risk disease, and otherwise excellent health, I would strongly recommend definitive treatment.  I think radiation or surgery would be very reasonable, he will meet with radiation oncology as well and let us know his final decision, he is leaning toward surgery at this point.   I spent 25 total minutes on the day of the encounter including pre-visit review of the medical record, face-to-face time with the patient, and post visit ordering of labs/imaging/tests.  Sondra Come, MD  [facility] [address], [address] [place], [place] [address] [phone]

## 2023-03-29 ENCOUNTER — Encounter: Payer: Self-pay | Admitting: Radiation Oncology

## 2023-03-29 ENCOUNTER — Ambulatory Visit
Admission: RE | Admit: 2023-03-29 | Discharge: 2023-03-29 | Disposition: A | Discharge status: Home / Self Care | Payer: Medicare HMO | Source: Ambulatory Visit | Attending: Radiation Oncology | Admitting: Radiation Oncology

## 2023-03-29 VITALS — BP 133/84 | HR 65 | Temp 97.7°F | Resp 16 | Ht 68.0 in | Wt 190.0 lb

## 2023-03-29 DIAGNOSIS — E785 Hyperlipidemia, unspecified: Secondary | ICD-10-CM | POA: Diagnosis not present

## 2023-03-29 DIAGNOSIS — C61 Malignant neoplasm of prostate: Secondary | ICD-10-CM | POA: Diagnosis present

## 2023-03-29 DIAGNOSIS — I10 Essential (primary) hypertension: Secondary | ICD-10-CM | POA: Insufficient documentation

## 2023-03-29 DIAGNOSIS — Z79899 Other long term (current) drug therapy: Secondary | ICD-10-CM | POA: Insufficient documentation

## 2023-03-29 DIAGNOSIS — K219 Gastro-esophageal reflux disease without esophagitis: Secondary | ICD-10-CM | POA: Diagnosis not present

## 2023-03-29 NOTE — Progress Notes (Signed)
NEW PATIENT EVALUATION  Name: James Haas  MRN: [id number]  Date:   03/29/2023     DOB: [birth year]   This 72 y.o. 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  REFERRING PHYSICIAN: Rolm Gala, MD  CHIEF COMPLAINT:  Chief Complaint  Patient presents with   Prostate Cancer    DIAGNOSIS: The encounter diagnosis was Prostate cancer (HCC).   PREVIOUS INVESTIGATIONS:  MRI scan reviewed Pathology reports reviewed Clinical notes reviewed  HPI: Patient is a 72 year old male who has been followed since 2021 for an elevated PSA of 5.7.  Most recently his PSA is risen 16.6.  He had a MRI scan showing a 45 cc prostate with PI-RADS 4 lesions in the left transition zone and a PI-RADS 3 lesion in the right peripheral zone.  MRI fusion biopsy was performed showing 4 of 12 cores positive mostly Gleason 7 (3+4) adenocarcinoma.  Patient is fairly asymptomatic specifically denies any increased lower urinary tract symptoms bone pain or bowel complaints.  He has been counseled on surgical intervention more specifically robotic assisted prostatectomy is seen today for radiation oncology opinion.  PLANNED TREATMENT REGIMEN: Prostatectomy  PAST MEDICAL HISTORY:  has a past medical history of GERD (gastroesophageal reflux disease), Hyperlipemia, and Hypertension.    PAST SURGICAL HISTORY:  Past Surgical History:  Procedure Laterality Date   ESOPHAGOGASTRODUODENOSCOPY (EGD) WITH PROPOFOL N/A 02/16/2018   Procedure: ESOPHAGOGASTRODUODENOSCOPY (EGD) WITH biopsy;  Surgeon: Midge Minium, MD;  Location: [facility];  Service: Endoscopy;  Laterality: N/A;  prefers later appt   TONSILLECTOMY     as child   WISDOM TOOTH EXTRACTION      FAMILY HISTORY: family history is not on file.  SOCIAL HISTORY:  reports that he has never smoked. He has never been exposed to tobacco  smoke. He has never used smokeless tobacco. He reports current alcohol use of about 10.0 standard drinks of alcohol per week. He reports that he does not use drugs.  ALLERGIES: Patient has no known allergies.  MEDICATIONS:  Current Outpatient Medications  Medication Sig Dispense Refill   aspirin EC 81 MG tablet Take by mouth.     chlorthalidone (HYGROTON) 25 MG tablet      hydrochlorothiazide (HYDRODIURIL) 12.5 MG tablet hydrochlorothiazide 12.5 mg tablet  Take 1 tablet every day by oral route.     hydrOXYzine (ATARAX/VISTARIL) 10 MG tablet hydroxyzine HCl 10 mg tablet     ibuprofen (ADVIL) 400 MG tablet Take by mouth.     KLOR-CON M10 10 MEQ tablet Take 10 mEq by mouth daily.     lisinopril (PRINIVIL,ZESTRIL) 40 MG tablet      Multiple Vitamin (MULTIVITAMIN) tablet Take by mouth.     Omega-3 1000 MG CAPS Take by mouth.     pantoprazole (PROTONIX) 40 MG tablet Take 1 tablet by mouth daily.     rosuvastatin (CRESTOR) 5 MG tablet      tamsulosin (FLOMAX) 0.4 MG CAPS capsule Take 0.4 mg by mouth daily. (Patient not taking: Reported on 03/29/2023)     No current facility-administered medications for this encounter.    ECOG PERFORMANCE STATUS:  0 - Asymptomatic  REVIEW OF SYSTEMS: Patient denies any weight loss, fatigue, weakness, fever, chills or night sweats. Patient denies any loss of vision, blurred vision. Patient denies any ringing  of the ears or hearing loss. No irregular heartbeat. Patient  denies heart murmur or history of fainting. Patient denies any chest pain or pain radiating to her upper extremities. Patient denies any shortness of breath, difficulty breathing at night, cough or hemoptysis. Patient denies any swelling in the lower legs. Patient denies any nausea vomiting, vomiting of blood, or coffee ground material in the vomitus. Patient denies any stomach pain. Patient states has had normal bowel movements no significant constipation or diarrhea. Patient denies any dysuria,  hematuria or significant nocturia. Patient denies any problems walking, swelling in the joints or loss of balance. Patient denies any skin changes, loss of hair or loss of weight. Patient denies any excessive worrying or anxiety or significant depression. Patient denies any problems with insomnia. Patient denies excessive thirst, polyuria, polydipsia. Patient denies any swollen glands, patient denies easy bruising or easy bleeding. Patient denies any recent infections, allergies or URI. Patient "s visual fields have not changed significantly in recent time.   PHYSICAL EXAM: BP 133/84   Pulse 65   Temp 97.7 F (36.5 C) (Tympanic)   Resp 16   Ht 5\' 8"  (1.727 m)   Wt 190 lb (86.2 kg)   BMI 28.89 kg/m  Well-developed well-nourished patient in NAD. HEENT reveals PERLA, EOMI, discs not visualized.  Oral cavity is clear. No oral mucosal lesions are identified. Neck is clear without evidence of cervical or supraclavicular adenopathy. Lungs are clear to A&P. Cardiac examination is essentially unremarkable with regular rate and rhythm without murmur rub or thrill. Abdomen is benign with no organomegaly or masses noted. Motor sensory and DTR levels are equal and symmetric in the upper and lower extremities. Cranial nerves II through XII are grossly intact. Proprioception is intact. No peripheral adenopathy or edema is identified. No motor or sensory levels are noted. Crude visual fields are within normal range.  LABORATORY DATA: Pathology reports reviewed    RADIOLOGY RESULTS: MRI scan reviewed compatible with above-stated findings   IMPRESSION: Stage IIb Gleason 7 (3+4) adenocarcinoma the prostate presenting with a PSA of 30 and a 72 year old male  PLAN: At this time agree with urology that definitive treatment based on his rapidly rising PSA and biopsy-proven disease is indicated.  I have discussed the risks and benefits of radiation therapy including brachytherapy as well as IMRT radiation therapy.  I  have counseled him that the big difference in someone of his age and excellent performance status would be the ability to salvage him should there be biochemical recurrence after prostatectomy.  The converse is not true salvage treatment is limited after radiation.  Patient is leaning toward surgery and I believe at this time he is elected to go that route.  He will contact urology's office for follow-up.  Patient is to call at anytime with any further questions or concerns.  I would like to take this opportunity to thank you for allowing me to participate in the care of your patient.Carmina Miller, MD

## 2023-03-30 ENCOUNTER — Ambulatory Visit: Payer: Medicare HMO | Admitting: Urology

## 2023-03-30 ENCOUNTER — Other Ambulatory Visit: Payer: Self-pay | Admitting: Urology

## 2023-03-30 ENCOUNTER — Encounter: Payer: Self-pay | Admitting: Urology

## 2023-03-30 VITALS — BP 129/82 | HR 87 | Ht 68.0 in | Wt 190.0 lb

## 2023-03-30 DIAGNOSIS — C61 Malignant neoplasm of prostate: Secondary | ICD-10-CM

## 2023-03-30 NOTE — Patient Instructions (Signed)
Kegel Exercises  Kegel exercises can help strengthen your pelvic floor muscles. The pelvic floor is a group of muscles that support your rectum, small intestine, and bladder. In females, pelvic floor muscles also help support the uterus. These muscles help you control the flow of urine and stool (feces). Kegel exercises are painless and simple. They do not require any equipment. Your provider may suggest Kegel exercises to: Improve bladder and bowel control. Improve sexual response. Improve weak pelvic floor muscles after surgery to remove the uterus (hysterectomy) or after pregnancy, in females. Improve weak pelvic floor muscles after prostate gland removal or surgery, in males. Kegel exercises involve squeezing your pelvic floor muscles. These are the same muscles you squeeze when you try to stop the flow of urine or keep from passing gas. The exercises can be done while sitting, standing, or lying down, but it is best to vary your position. Ask your health care provider which exercises are safe for you. Do exercises exactly as told by your health care provider and adjust them as directed. Do not begin these exercises until told by your health care provider. Exercises How to do Kegel exercises: Squeeze your pelvic floor muscles tight. You should feel a tight lift in your rectal area. If you are a male, you should also feel a tightness in your vaginal area. Keep your stomach, buttocks, and legs relaxed. Hold the muscles tight for up to 10 seconds. Breathe normally. Relax your muscles for up to 10 seconds. Repeat as told by your health care provider. Repeat this exercise daily as told by your health care provider. Continue to do this exercise for at least 4-6 weeks, or for as long as told by your health care provider. You may be referred to a physical therapist who can help you learn more about how to do Kegel exercises. Depending on your condition, your health care provider may  recommend: Varying how long you squeeze your muscles. Doing several sets of exercises every day. Doing exercises for several weeks. Making Kegel exercises a part of your regular exercise routine. This information is not intended to replace advice given to you by your health care provider. Make sure you discuss any questions you have with your health care provider. Document Revised: 12/03/2020 Document Reviewed: 12/03/2020 Elsevier Patient Education  2024 [employer]    Robot-Assisted Laparoscopic Radical Prostatectomy  Robot-assisted laparoscopic radical prostatectomy is surgery done to remove the entire prostate and nearby tissue. This includes the seminal vesicles, which are near the bladder and the prostate. This procedure is done to treat prostate cancer that has not spread (metastasized) to other parts of the body. The goal of the surgery is to remove all cancer cells to help keep the cancer from metastasizing. During this procedure, the surgeon makes several incisions in the abdomen instead of one large incision. A long, thin, lighted tube with a tiny camera on the end (laparoscope) is put into one of the incisions. This allows the surgeon to see inside the abdomen. Other surgical tools are put in through the other incisions and used to take out the prostate and nearby tissues. The surgeon uses robotic arms to control these tools while sitting at a computer near the operating table. Lymph nodes in the pelvis may also be removed. Lymph nodes are part of the body's disease-fighting system (immune system). When prostate cancer spreads, it tends to go to the lymph nodes in the pelvis first. If the pelvic lymph nodes are removed, they will be  checked for cancer cells. Tell a health care provider about: Any allergies you have. All medicines you are taking, including vitamins, herbs, eye drops, creams, and over-the-counter medicines. Any problems you or family members have had with anesthetic  medicines. Any bleeding problems you have. Any surgeries you have had. Any medical conditions you have. Any prostate infections you have had. What are the risks? Generally, this is a safe procedure. Still, problems may occur, including: Infection. Bleeding. Allergic reactions to medicines. Damage to nearby structures or organs, such as the rectum, ureters, urethra, bladder, or small intestine. Blockage (obstruction) of the large or small intestines. Problems that affect urination or sexual function. These may include: Narrowing or scarring of the urethra (stricture), which may block the flow of urine. Inability to control when you urinate (incontinence). Inability to get or keep an erection (erectile dysfunction). Dry ejaculation. This is when no semen comes out during orgasm. The formation of a sac (cyst) in the pelvis that is filled with fluid from the lymph glands (lymphocele). Blood clots in the legs. What happens before the procedure? Staying hydrated Follow instructions from your health care provider about hydration, which may include: Up to 2 hours before the procedure - you may continue to drink clear liquids, such as water, clear fruit juice, black coffee, and plain tea.  Eating and drinking restrictions Follow instructions from your health care provider about eating and drinking, which may include: 8 hours before the procedure - stop eating heavy meals or foods, such as meat, fried foods, or fatty foods. 6 hours before the procedure - stop eating light meals or foods, such as toast or cereal. 6 hours before the procedure - stop drinking milk or drinks that contain milk. 2 hours before the procedure - stop drinking clear liquids. Medicines Ask your health care provider about: Changing or stopping your regular medicines. This is especially important if you are taking diabetes medicines or blood thinners. Taking medicines such as aspirin and ibuprofen. These medicines can thin  your blood. Do not take these medicines unless your health care provider tells you to take them. Taking over-the-counter medicines, vitamins, herbs, and supplements. Follow your health care provider's instructions about cleaning out your bowels. Surgery safety Ask your health care provider: How your surgery site will be marked. What steps will be taken to help prevent infection. These steps may include: Removing hair at the surgery site. Washing skin with a germ-killing soap. Taking antibiotic medicine. General instructions Do not use any products that contain nicotine or tobacco for at least 4 weeks before the procedure. These products include cigarettes, chewing tobacco, and vaping devices, such as e-cigarettes. If you need help quitting, ask your health care provider. Plan to have a responsible adult take you home from the hospital or clinic. Plan to have a responsible adult care for you for the time you are told after you leave the hospital or clinic. You may have an exam or testing. This may include blood or urine samples, or imaging tests such as a CT scan or an MRI. What happens during the procedure? An IV will be put into a vein in your hand or arm. You may be given: A medicine to help you relax (sedative). A medicine to make you fall asleep (general anesthetic). A thin, flexible tube (Foley catheter) will be put into your penis through your urethra and into your bladder to drain your urine. Small incisions will be made in your abdomen and near your belly button. The laparoscope  and other surgical instruments will be put through the incisions. The surgical tools will be used to cut and remove your prostate, seminal vesicles, and maybe your pelvic lymph nodes. Your surgeon will use a computer and robotic arms to control the surgical instruments. Your urethra will be cut and separated from your bladder to take out the prostate. Your urethra will then be reconnected to your bladder neck.  This is the group of muscles that help push urine through your urethra. A small tube (drain) may be put in one or more of your incisions to help drain extra fluid from your surgical site after surgery. The laparoscope and other surgical instruments will be removed. Your incisions will be closed with stitches (sutures), skin glue, or adhesive strips. Medicine may be applied and bandages (dressings) will be placed over your incisions. The procedure may vary among health care providers and hospitals. What happens after the procedure? Your blood pressure, heart rate, breathing rate, and blood oxygen level will be monitored until you leave the hospital or clinic. You may get fluids and medicines through your IV. You may be given antibiotics and medicines to help relieve pain or nausea. You will be encouraged to walk as soon as possible. You will also use a device or do breathing exercises to keep your lungs clear. The catheter will stay in to drain urine from your bladder. You will be taught how to care for it at home. The drain may stay in to drain fluid from the surgical site. If so, you will be taught how to care for it at home. You may need to wear compression stockings until you are able to get up and walk around. These stockings help prevent blood clots and reduce swelling in your legs. If you were given a sedative during the procedure, it can affect you for several hours. Do not drive or operate machinery until your health care provider says that it is safe. Summary Robot-assisted laparoscopic radical prostatectomy is a surgical procedure to remove the entire prostate and the seminal vesicles. Follow instructions from your health care provider about eating and drinking before your surgery. After your procedure, you may be given fluids and medicines through an IV. You may get antibiotics and medicines to help relieve pain or nausea. After your surgery, you will continue to have a small, thin tube  (Foley catheter) draining your urine. You will be taught how to care for it at home. This information is not intended to replace advice given to you by your health care provider. Make sure you discuss any questions you have with your health care provider. Document Revised: 10/21/2020 Document Reviewed: 10/21/2020 Elsevier Patient Education  2024 [employer] Inc.  Robot-Assisted Laparoscopic Radical Prostatectomy, Care After The following information offers guidance on how to care for yourself after your procedure. Your health care provider may also give you more specific instructions. If you have problems or questions, contact your health care provider. What can I expect after the procedure? After the procedure, it is common to have: Mild pain in your abdomen. Blood and small clots in your urine for up to 3 weeks. Bladder spasms, or a need to urinate more often than usual. These may last for up to 2 weeks. Swelling in your penis and in the sac that contains your testicles (scrotum). After your thin, flexible urinary tube (Foley catheter)is removed, it is common to have: Burning when you pass urine. This may last for up to 2 weeks after your catheter is  removed. Trouble controlling when you urinate (incontinence). You may leak urine sometimes. Your ability to control your urine should improve as you heal. Follow these instructions at home: Medicines Take over-the-counter and prescription medicines only as told by your health care provider. This includes any stool softeners. If you were prescribed an antibiotic, take it as told by your health care provider. Do not stop taking the antibiotic early, even if you start to feel better. Ask your health care provider if the medicine prescribed to you: Requires you to avoid driving or using machinery. Can cause constipation. You may need to take these actions to prevent or treat constipation: Take over-the-counter or prescription medicines. Eat foods  that are high in fiber, such as beans, whole grains, and fresh fruits and vegetables. Limit foods that are high in fat and processed sugars, such as fried or sweet foods. Eating and drinking  Follow instructions from your health care provider about eating or drinking restrictions. Drink enough fluid to keep your urine pale yellow. Incision care and drain care  Follow instructions from your health care provider about how to take care of your incisions and drains, if present. Make sure you: Wash your hands with soap and water for at least 20 seconds before and after you change your bandages (dressings) or handle drains and drainage tubes. If soap and water are not available, use hand sanitizer. Change your dressings as told by your health care provider. Leave stitches (sutures), skin glue, or adhesive strips in place. These skin closures may need to stay in place for 2 weeks or longer. If adhesive strip edges start to loosen and curl up, you may trim the loose edges. Do not remove adhesive strips unless your health care provider tells you to do that. If you have a drain in any incision, follow instructions about how to care for your drain. Do not remove the drain tube or any dressing around it unless your health care provider approves. Check your incision site and the skin around your drain every day for signs of infection. Check for: More redness, swelling, or pain. New fluid or blood. Some dried fluid or blood may be present. Warmth. Pus or a bad smell. Catheter care Always wash your hands with soap and water for at least 20 seconds before and after touching your penis, catheter, and drainage bag. If soap and water are not available, use hand sanitizer. Empty your collection bag every 3 hours during the day, or as often as told by your health care provider. Make sure to keep the catheter secured to keep from tugging on it or pulling it out. Make sure there are no kinks in the tubing that could  block the flow of urine. Keep the urine collection bag below the level of your bladder. Clean the tip of your penis with soap and water twice a day, or as often as told by your health care provider. If you were given ointment to apply to the tip of your penis, follow instructions about how to do this. You will need to visit your health care provider to have your catheter removed. It may stay in for up to 3 weeks after your procedure. Managing pain and swelling If directed, put ice on the affected area. To do this: Put ice in a plastic bag. Place a towel between your skin and the bag. Leave the ice on for 20 minutes, 2-3 times a day. Remove the ice if your skin turns bright red. If you cannot  feel pain, heat, or cold, you have a greater risk of damage to the area. Activity Do not lift anything that is heavier than 10 lb (4.5 kg), or the limit that you are told, until your health care provider says that it is safe. Avoid intense physical activity for as long as told by your health care provider. Avoid sitting for a long time without moving. Get up to take short walks every 1-2 hours. This improves your blood flow and breathing. Ask for help if you feel weak or unsteady. Return to your normal activities as told by your health care provider. Ask your health care provider what activities are safe for you. You may be able to exercise more as you feel better. Ask your health care provider when it is safe for you to drive. Follow instructions from your health care provider about when it is safe for you to engage in sexual activity. General instructions Do not take baths, swim, or use a hot tub until your health care provider approves. Ask your health care provider if you may take showers. You may only be allowed to take sponge baths. Do not strain to have a bowel movement. Do not use any products that contain nicotine or tobacco. These products include cigarettes, chewing tobacco, and vaping devices, such  as e-cigarettes. If you need help quitting, ask your health care provider. If your pelvic lymph nodes were removed for testing, it may take a few weeks to get the results. Ask your health care provider, or the department that is doing the test, when your results will be ready and how you will get them. Keep all follow-up visits. Contact a health care provider if: You have a fever. An incision opens up. You have any of the following around your incision site or drain area: More redness, swelling, or pain. New fluid or blood. Warmth. Pus or a bad smell. You have any of these signs of a urinary tract infection: Pain or burning when you urinate. Needing to urinate often without being able to pass much urine. Pain in your back or lower abdomen. You have problems with your catheter or drain, if present. You have bladder spasms more than 3 or 4 times a day. You become constipated. You have pain that gets worse. Get help right away if: Your catheter falls out. You cannot urinate or urine stops coming out through the catheter. You have more bright red blood in your urine 3 weeks or more after your procedure. You have large clots in your urine. You develop: Chest pain or shortness of breath. Swelling, warmth, or pain in your legs. Severe pain or swelling in your abdomen. These symptoms may represent a serious problem that is an emergency. Do not wait to see if the symptoms will go away. Get medical help right away. Call your local emergency services (911 in the [place] Do not drive yourself to the hospital. Summary After your procedure, it is common to have mild pain, blood and small clots in your urine for up to 3 weeks, and a need to urinate more often. Follow instructions from your health care provider about how to take care of your incisions, catheter, and drain, if present. If your pelvic lymph nodes were removed for testing, it may take a few weeks to get the results. Ask your health care  provider, or the department that is doing the test, when your results will be ready and how you will get them. Keep all follow-up visits. This  information is not intended to replace advice given to you by your health care provider. Make sure you discuss any questions you have with your health care provider. Document Revised: 10/21/2020 Document Reviewed: 10/21/2020 Elsevier Patient Education  2024 [employer].

## 2023-03-30 NOTE — Progress Notes (Signed)
   03/30/2023 3:45 PM   LENNYN VASBINDER Haas 1950/09/14 [id number]  Reason for visit: Prostate cancer, review biopsy results  HPI: Healthy 72 year old male originally diagnosed with low risk prostate cancer in November 2021 for PSA of 5.7, PSA has risen over the last year up to 16.6, and prostate MRI showed a 45 g prostate(PSA density 0.36), with PI-RADS 4 lesion in the left transition zone, and PI-RADS 3 lesion in the right peripheral zone.  He underwent an MRI fusion biopsy on 03/08/2023.  Biopsy showed Gleason score 3+4=7 disease in the PI-RADS 4 ROI, as well as an additional core of 3+4=7 in the left lateral prostate, and 2 additional cores of Gleason score 3+3=6.  He has a moderate-sized median lobe on MRI.  He denies any problems with erections.  Takes Flomax for weak stream.  We previously met on 03/16/2023 to discuss surgery versus radiation, and after meeting with radiation oncology he opted to pursue surgery and wanted to discuss surgery again in person.  In regards to surgery, we discussed robotic prostatectomy +/- lymphadenectomy at length.  The procedure takes 3 to 4 hours, and patient's typically discharge home on post-op day #1.  A Foley catheter is left in place for 7 to 10 days to allow for healing of the vesicourethral anastomosis.  There is a small risk of bleeding, infection, damage to surrounding structures or bowel, hernia, DVT/PE, or serious cardiac or pulmonary complications.  We discussed at length post-op side effects including erectile dysfunction, and the importance of pre-operative erectile function on long-term outcomes.  Even with a nerve sparing approach, there is an approximately 25% rate of permanent erectile dysfunction.  We also discussed postop urinary incontinence at length.  We expect patients to have stress incontinence post-operatively that will improve over period of weeks to months.  Less than 10% of men will require a pad at 1 year after surgery.  Patients will  need to avoid heavy lifting and strenuous activity for 3 to 4 weeks, but most men return to their baseline activity status by 6 weeks.  In summary, James Haas is a 72 y.o. man with history of low risk prostate cancer originally diagnosed in 2021, persistently rising PSA up to 16.6 with PI-RADS 4 lesion on MRI, and MRI fusion biopsy confirming change of disease to Gleason score 3+4=7 favorable intermediate risk disease.  Using shared decision making, he would like to pursue robotic radical prostatectomy and lymph node dissection for definitive treatment.   Sondra Come, MD  [facility] [address], [address] [place], [place] [address] [phone]

## 2023-03-31 ENCOUNTER — Other Ambulatory Visit: Payer: Self-pay

## 2023-03-31 DIAGNOSIS — C61 Malignant neoplasm of prostate: Secondary | ICD-10-CM

## 2023-03-31 NOTE — Progress Notes (Unsigned)
Surgical Physician Order Form [facility]  Dr. Legrand Rams, MD  * Scheduling expectation : Next Available  *Length of Case: 4 hours  *Clearance needed: no  *Anticoagulation Instructions: Hold all anticoagulants  *Aspirin Instructions: Hold Aspirin  *Post-op visit Date/Instructions:  1 week cath removal  *Diagnosis: Prostate Cancer  *Procedure:  Robotic laparoscopic Prostatectomy (25366) and lymph node dissection   Additional orders: N/A  -Admit type: Observation  -Anesthesia: General  -VTE Prophylaxis Standing Order SCD's       Other: 5000 units subcutaneous heparin  -Standing Lab Orders Per Anesthesia    Lab other: CBC, BMP, type and screen  -Standing Test orders EKG/Chest x-ray per Anesthesia       Test other:   - Medications:  Ancef 2gm IV  -Other orders:  N/A

## 2023-04-04 ENCOUNTER — Telehealth: Payer: Self-pay

## 2023-04-04 NOTE — Telephone Encounter (Signed)
Per Dr. Richardo Hanks, Patient is to be scheduled for Robotic Laparoscopic Radical Prostatectomy with Bilateral Pelvic Lymph Node Dissection  James Haas was contacted and possible surgical dates were discussed, Monday October 7th, 2024 was agreed upon for surgery.   Patient was directed to call [phone] between 1-3pm the day before surgery to find out surgical arrival time.  Instructions were given not to eat or drink from midnight on the night before surgery and have a driver for the day of surgery. On the surgery day patient was instructed to enter through the [facility] entrance of [facility] report the Same Day Surgery desk.   Pre-Admit Testing will be in contact via phone to set up an interview with the anesthesia team to review your history and medications prior to surgery.   Reminder of this information was sent via MyChart to the patient.

## 2023-04-04 NOTE — Progress Notes (Signed)
   [facility] Surgical Posting Form  Surgery Date: Date: 05/15/2023  Surgeon: Dr. Legrand Rams, MD  Inpt ( No  )   Outpt (No)   Obs ( Yes  )   Diagnosis: C61 Prostate Cancer  -CPT: [phone], [phone]  Surgery: Robotic Laparoscopic Radical Prostatectomy with Bilateral Pelvic Lymph Node Dissection  Stop Anticoagulations: Yes and also hold ASA  Cardiac/Medical/Pulmonary Clearance needed: no  *Orders entered into EPIC  Date: 04/04/23   *Case booked in [place]  Date: 03/31/2023  *Notified pt of Surgery: Date: 03/31/2023  PRE-OP UA & CX: yes and will also obtain CBC, BMP, Type and Screen, INR  *Placed into Prior Authorization Work [place] Date: 04/04/23  Assistant/laser/rep:No

## 2023-05-08 ENCOUNTER — Encounter
Admission: RE | Admit: 2023-05-08 | Discharge: 2023-05-08 | Disposition: A | Discharge status: Home / Self Care | Payer: Medicare HMO | Source: Ambulatory Visit | Attending: Urology | Admitting: Urology

## 2023-05-08 ENCOUNTER — Other Ambulatory Visit: Payer: Self-pay

## 2023-05-08 VITALS — Ht 68.0 in | Wt 187.0 lb

## 2023-05-08 DIAGNOSIS — R739 Hyperglycemia, unspecified: Secondary | ICD-10-CM

## 2023-05-08 DIAGNOSIS — I10 Essential (primary) hypertension: Secondary | ICD-10-CM

## 2023-05-08 DIAGNOSIS — E7800 Pure hypercholesterolemia, unspecified: Secondary | ICD-10-CM

## 2023-05-08 DIAGNOSIS — Z01812 Encounter for preprocedural laboratory examination: Secondary | ICD-10-CM

## 2023-05-08 HISTORY — DX: Elevated prostate specific antigen (PSA): R97.20

## 2023-05-08 HISTORY — DX: Personal history of other malignant neoplasm of skin: Z85.828

## 2023-05-08 HISTORY — DX: Olecranon bursitis, unspecified elbow: M70.20

## 2023-05-08 HISTORY — DX: Epigastric pain: R10.13

## 2023-05-08 HISTORY — DX: Hyperglycemia, unspecified: R73.9

## 2023-05-08 HISTORY — DX: Other benign neoplasm of skin, unspecified: D23.9

## 2023-05-08 HISTORY — DX: Gout, unspecified: M10.9

## 2023-05-08 HISTORY — DX: Diaphragmatic hernia without obstruction or gangrene: K44.9

## 2023-05-08 HISTORY — DX: Pure hypercholesterolemia, unspecified: E78.00

## 2023-05-08 NOTE — Patient Instructions (Addendum)
Your procedure is scheduled on:  Monday October 7  Report to the Registration Desk on the 1st floor of the [facility] To find out your arrival time, please call [phone] between 1PM - 3PM on:  Friday October 4  If your arrival time is 6:00 am, do not arrive before that time as the [facility] entrance doors do not open until 6:00 am.  REMEMBER: Instructions that are not followed completely may result in serious medical risk, up to and including death; or upon the discretion of your surgeon and anesthesiologist your surgery may need to be rescheduled.  Do not eat food after midnight the night before surgery.  No gum chewing or hard candies.  One week prior to surgery: Monday September 30  Stop Anti-inflammatories (NSAIDS) such as Advil, Aleve, Ibuprofen, Motrin, Naproxen, Naprosyn and Aspirin based products such as Excedrin, Goody's Powder, BC Powder.  Stop ANY OVER THE COUNTER supplements until after surgery. KLOR-CON  Multiple Vitamin (MULTIVITAMIN)  Omega-3  You may however, continue to take Tylenol if needed for pain up until the day of surgery.  Continue taking all prescribed medications with the exception of the following: chlorthalidone (HYGROTON)  hold the day of surgery, last dose Sunday October 6  hydrochlorothiazide (HYDRODIURIL hold the day of surgery, last dose Sunday October 6  lisinopril (PRINIVIL,ZESTRIL) hold the day of surgery, last dose Sunday October 6    TAKE ONLY THESE MEDICATIONS THE MORNING OF SURGERY WITH A SIP OF WATER:  pantoprazole (PROTONIX) (take one the night before and one on the morning of surgery - helps to prevent nausea after surgery.) rosuvastatin (CRESTOR)    No Alcohol for 24 hours before or after surgery.  No Smoking including e-cigarettes for 24 hours before surgery.  No chewable tobacco products for at least 6 hours before surgery.  No nicotine patches on the day of surgery.  Do not use any "recreational" drugs for at least a  week (preferably 2 weeks) before your surgery.  Please be advised that the combination of cocaine and anesthesia may have negative outcomes, up to and including death. If you test positive for cocaine, your surgery will be cancelled.  On the morning of surgery brush your teeth with toothpaste and water, you may rinse your mouth with mouthwash if you wish. Do not swallow any toothpaste or mouthwash.  Do not wear jewelry, make-up, hairpins, clips or nail polish.  For welded (permanent) jewelry: bracelets, anklets, waist bands, etc.  Please have this removed prior to surgery.  If it is not removed, there is a chance that hospital personnel will need to cut it off on the day of surgery.  Do not wear lotions, powders, or perfumes.   Do not shave body hair from the neck down 48 hours before surgery.  Do not bring valuables to the hospital. [facility] is not responsible for any missing/lost belongings or valuables.   Notify your doctor if there is any change in your medical condition (cold, fever, infection).  Wear comfortable clothing (specific to your surgery type) to the hospital.  After surgery, you can help prevent lung complications by doing breathing exercises.  Take deep breaths and cough every 1-2 hours.   If you are being admitted to the hospital overnight, leave your suitcase in the car. After surgery it may be brought to your room.  In case of increased patient census, it may be necessary for you, the patient, to continue your postoperative care in the Same Day Surgery department.  If you are being discharged the day of surgery, you will not be allowed to drive home. You will need a responsible individual to drive you home and stay with you for 24 hours after surgery.   If you are taking public transportation, you will need to have a responsible individual with you.  Please call the Pre-admissions Testing Dept. at [phone] if you have any questions about these  instructions.  Surgery Visitation Policy:  Patients having surgery or a procedure may have two visitors.  Children under the age of 62 must have an adult with them who is not the patient.  Inpatient Visitation:    Visiting hours are 7 a.m. to 8 p.m. Up to four visitors are allowed at one time in a patient room. The visitors may rotate out with other people during the day.  One visitor age 47 or older may stay with the patient overnight and must be in the room by 8 p.m.

## 2023-05-11 ENCOUNTER — Encounter
Admission: RE | Admit: 2023-05-11 | Discharge: 2023-05-11 | Disposition: A | Discharge status: Home / Self Care | Payer: Medicare HMO | Source: Ambulatory Visit | Attending: Urology | Admitting: Urology

## 2023-05-11 DIAGNOSIS — E7800 Pure hypercholesterolemia, unspecified: Secondary | ICD-10-CM | POA: Insufficient documentation

## 2023-05-11 DIAGNOSIS — Z01818 Encounter for other preprocedural examination: Secondary | ICD-10-CM | POA: Insufficient documentation

## 2023-05-11 DIAGNOSIS — I440 Atrioventricular block, first degree: Secondary | ICD-10-CM | POA: Insufficient documentation

## 2023-05-11 DIAGNOSIS — C61 Malignant neoplasm of prostate: Secondary | ICD-10-CM | POA: Insufficient documentation

## 2023-05-11 DIAGNOSIS — I10 Essential (primary) hypertension: Secondary | ICD-10-CM | POA: Insufficient documentation

## 2023-05-11 DIAGNOSIS — R739 Hyperglycemia, unspecified: Secondary | ICD-10-CM | POA: Insufficient documentation

## 2023-05-11 DIAGNOSIS — Z01812 Encounter for preprocedural laboratory examination: Secondary | ICD-10-CM

## 2023-05-11 DIAGNOSIS — Z01810 Encounter for preprocedural cardiovascular examination: Secondary | ICD-10-CM | POA: Diagnosis not present

## 2023-05-11 LAB — CBC
HCT: 40.1 % (ref 39.0–52.0)
Hemoglobin: 14.7 g/dL (ref 13.0–17.0)
MCH: 33 pg (ref 26.0–34.0)
MCHC: 36.7 g/dL — ABNORMAL HIGH (ref 30.0–36.0)
MCV: 89.9 fL (ref 80.0–100.0)
Platelets: 288 10*3/uL (ref 150–400)
RBC: 4.46 MIL/uL (ref 4.22–5.81)
RDW: 12.7 % (ref 11.5–15.5)
WBC: 6.9 10*3/uL (ref 4.0–10.5)
nRBC: 0 % (ref 0.0–0.2)

## 2023-05-11 LAB — BASIC METABOLIC PANEL
Anion gap: 11 (ref 5–15)
BUN: 12 mg/dL (ref 8–23)
CO2: 27 mmol/L (ref 22–32)
Calcium: 9.2 mg/dL (ref 8.9–10.3)
Chloride: 96 mmol/L — ABNORMAL LOW (ref 98–111)
Creatinine, Ser: 0.72 mg/dL (ref 0.61–1.24)
GFR, Estimated: >60 mL/min (ref >60)
Glucose, Bld: 112 mg/dL — ABNORMAL HIGH (ref 70–99)
Potassium: 3.2 mmol/L — ABNORMAL LOW (ref 3.5–5.1)
Sodium: 134 mmol/L — ABNORMAL LOW (ref 135–145)

## 2023-05-11 LAB — TYPE AND SCREEN
ABO/RH(D): O POS
Antibody Screen: NEGATIVE

## 2023-05-11 LAB — PROTIME-INR
INR: 1 (ref 0.8–1.2)
Prothrombin Time: 13.4 s (ref 11.4–15.2)

## 2023-05-13 LAB — URINE CULTURE: Culture: NO GROWTH

## 2023-05-15 ENCOUNTER — Other Ambulatory Visit: Payer: Self-pay

## 2023-05-15 ENCOUNTER — Ambulatory Visit: Payer: Medicare HMO | Admitting: Urgent Care

## 2023-05-15 ENCOUNTER — Inpatient Hospital Stay
Admission: AD | Admit: 2023-05-15 | Discharge: 2023-05-18 | DRG: 707 | Disposition: A | Discharge status: Home / Self Care | Payer: Medicare HMO | Attending: Urology | Admitting: Urology

## 2023-05-15 ENCOUNTER — Encounter: Payer: Self-pay | Admitting: Urology

## 2023-05-15 ENCOUNTER — Ambulatory Visit: Payer: Self-pay | Admitting: Urgent Care

## 2023-05-15 ENCOUNTER — Encounter
Admission: AD | Disposition: A | Discharge status: Home / Self Care | Payer: Self-pay | Source: Home / Self Care | Attending: Urology

## 2023-05-15 ENCOUNTER — Other Ambulatory Visit (HOSPITAL_COMMUNITY)
Admission: RE | Admit: 2023-05-15 | Discharge: 2023-05-15 | Disposition: A | Discharge status: Home / Self Care | Payer: Medicare HMO | Source: Ambulatory Visit | Attending: Urology | Admitting: Urology

## 2023-05-15 DIAGNOSIS — J9589 Other postprocedural complications and disorders of respiratory system, not elsewhere classified: Secondary | ICD-10-CM | POA: Diagnosis not present

## 2023-05-15 DIAGNOSIS — Z85828 Personal history of other malignant neoplasm of skin: Secondary | ICD-10-CM

## 2023-05-15 DIAGNOSIS — Z6828 Body mass index (BMI) 28.0-28.9, adult: Secondary | ICD-10-CM

## 2023-05-15 DIAGNOSIS — C61 Malignant neoplasm of prostate: Secondary | ICD-10-CM | POA: Diagnosis not present

## 2023-05-15 DIAGNOSIS — R972 Elevated prostate specific antigen [PSA]: Secondary | ICD-10-CM

## 2023-05-15 DIAGNOSIS — R3912 Poor urinary stream: Secondary | ICD-10-CM | POA: Diagnosis present

## 2023-05-15 DIAGNOSIS — Z01812 Encounter for preprocedural laboratory examination: Principal | ICD-10-CM

## 2023-05-15 DIAGNOSIS — I10 Essential (primary) hypertension: Secondary | ICD-10-CM | POA: Diagnosis present

## 2023-05-15 DIAGNOSIS — E7800 Pure hypercholesterolemia, unspecified: Secondary | ICD-10-CM | POA: Diagnosis present

## 2023-05-15 HISTORY — PX: PELVIC LYMPH NODE DISSECTION: SHX6543

## 2023-05-15 HISTORY — PX: ROBOT ASSISTED LAPAROSCOPIC RADICAL PROSTATECTOMY: SHX5141

## 2023-05-15 LAB — ABO/RH: ABO/RH(D): O POS

## 2023-05-15 SURGERY — PROSTATECTOMY, RADICAL, ROBOT-ASSISTED, LAPAROSCOPIC
Anesthesia: General

## 2023-05-15 MED ORDER — HYDROMORPHONE HCL 1 MG/ML IJ SOLN
INTRAMUSCULAR | Status: AC
  Filled 2023-05-15: qty 1

## 2023-05-15 MED ORDER — HEPARIN SODIUM (PORCINE) 5000 UNIT/ML IJ SOLN
5000.0000 [IU] | Freq: Two times a day (BID) | INTRAMUSCULAR | Status: DC
  Administered 2023-05-16 – 2023-05-18 (×5): 5000 [IU] via SUBCUTANEOUS
  Filled 2023-05-15 (×5): qty 1

## 2023-05-15 MED ORDER — LACTATED RINGERS IV SOLN: INTRAVENOUS | Status: DC

## 2023-05-15 MED ORDER — SUGAMMADEX SODIUM 200 MG/2ML IV SOLN
INTRAVENOUS | Status: DC | PRN
  Administered 2023-05-15: 200 mg via INTRAVENOUS

## 2023-05-15 MED ORDER — ACETAMINOPHEN 10 MG/ML IV SOLN
INTRAVENOUS | Status: DC | PRN
  Administered 2023-05-15: 1000 mg via INTRAVENOUS

## 2023-05-15 MED ORDER — ONDANSETRON HCL 4 MG/2ML IJ SOLN
4.0000 mg | INTRAMUSCULAR | Status: DC | PRN
  Administered 2023-05-16 – 2023-05-17 (×3): 4 mg via INTRAVENOUS
  Filled 2023-05-15 (×3): qty 2

## 2023-05-15 MED ORDER — FENTANYL CITRATE (PF) 100 MCG/2ML IJ SOLN
INTRAMUSCULAR | Status: AC
  Filled 2023-05-15: qty 2

## 2023-05-15 MED ORDER — OXYBUTYNIN CHLORIDE 5 MG PO TABS
5.0000 mg | ORAL_TABLET | Freq: Three times a day (TID) | ORAL | Status: DC | PRN
  Administered 2023-05-15 – 2023-05-17 (×4): 5 mg via ORAL
  Filled 2023-05-15 (×4): qty 1

## 2023-05-15 MED ORDER — ROCURONIUM BROMIDE 10 MG/ML (PF) SYRINGE
PREFILLED_SYRINGE | INTRAVENOUS | Status: AC
  Filled 2023-05-15: qty 10

## 2023-05-15 MED ORDER — HEPARIN SODIUM (PORCINE) 5000 UNIT/ML IJ SOLN
INTRAMUSCULAR | Status: AC
  Filled 2023-05-15: qty 1

## 2023-05-15 MED ORDER — KETAMINE HCL 10 MG/ML IJ SOLN
INTRAMUSCULAR | Status: DC | PRN
Start: 2023-05-15 — End: 2023-05-15
  Administered 2023-05-15: 10 mg via INTRAVENOUS

## 2023-05-15 MED ORDER — HYDROMORPHONE HCL 1 MG/ML IJ SOLN
INTRAMUSCULAR | Status: DC | PRN
Start: 2023-05-15 — End: 2023-05-15
  Administered 2023-05-15 (×2): 1 mg via INTRAVENOUS

## 2023-05-15 MED ORDER — LIDOCAINE HCL (PF) 2 % IJ SOLN
INTRAMUSCULAR | Status: AC
  Filled 2023-05-15: qty 5

## 2023-05-15 MED ORDER — PANTOPRAZOLE SODIUM 40 MG PO TBEC: 40.0000 mg | DELAYED_RELEASE_TABLET | ORAL | Status: DC | PRN

## 2023-05-15 MED ORDER — CEFAZOLIN SODIUM-DEXTROSE 2-4 GM/100ML-% IV SOLN
INTRAVENOUS | Status: AC
  Filled 2023-05-15: qty 100

## 2023-05-15 MED ORDER — CHLORHEXIDINE GLUCONATE 0.12 % MT SOLN
OROMUCOSAL | Status: AC
  Filled 2023-05-15: qty 15

## 2023-05-15 MED ORDER — GLYCOPYRROLATE 0.2 MG/ML IJ SOLN
INTRAMUSCULAR | Status: DC | PRN
  Administered 2023-05-15: .2 mg via INTRAVENOUS

## 2023-05-15 MED ORDER — ACETAMINOPHEN 500 MG PO TABS
1000.0000 mg | ORAL_TABLET | Freq: Four times a day (QID) | ORAL | Status: AC
  Administered 2023-05-16: 1000 mg via ORAL
  Filled 2023-05-15 (×3): qty 2

## 2023-05-15 MED ORDER — SUCCINYLCHOLINE CHLORIDE 200 MG/10ML IV SOSY
PREFILLED_SYRINGE | INTRAVENOUS | Status: DC | PRN
  Administered 2023-05-15: 100 mg via INTRAVENOUS

## 2023-05-15 MED ORDER — BUPIVACAINE HCL (PF) 0.5 % IJ SOLN
INTRAMUSCULAR | Status: AC
  Filled 2023-05-15: qty 30

## 2023-05-15 MED ORDER — DOCUSATE SODIUM 100 MG PO CAPS
100.0000 mg | ORAL_CAPSULE | Freq: Two times a day (BID) | ORAL | Status: DC
  Administered 2023-05-15 – 2023-05-18 (×6): 100 mg via ORAL
  Filled 2023-05-15 (×6): qty 1

## 2023-05-15 MED ORDER — STERILE WATER FOR IRRIGATION IR SOLN
Status: DC | PRN
  Administered 2023-05-15: 1

## 2023-05-15 MED ORDER — FENTANYL CITRATE (PF) 100 MCG/2ML IJ SOLN
INTRAMUSCULAR | Status: DC | PRN
  Administered 2023-05-15: 50 ug via INTRAVENOUS

## 2023-05-15 MED ORDER — HYDROMORPHONE HCL 1 MG/ML IJ SOLN
0.5000 mg | INTRAMUSCULAR | Status: DC | PRN
  Administered 2023-05-16 – 2023-05-17 (×2): 1 mg via INTRAVENOUS
  Filled 2023-05-15 (×2): qty 1

## 2023-05-15 MED ORDER — LISINOPRIL 20 MG PO TABS
40.0000 mg | ORAL_TABLET | Freq: Every day | ORAL | Status: DC
  Administered 2023-05-15 – 2023-05-18 (×3): 40 mg via ORAL
  Filled 2023-05-15 (×4): qty 2

## 2023-05-15 MED ORDER — ROSUVASTATIN CALCIUM 10 MG PO TABS
5.0000 mg | ORAL_TABLET | Freq: Every day | ORAL | Status: DC
  Administered 2023-05-15 – 2023-05-18 (×4): 5 mg via ORAL
  Filled 2023-05-15 (×4): qty 1

## 2023-05-15 MED ORDER — PHENYLEPHRINE 80 MCG/ML (10ML) SYRINGE FOR IV PUSH (FOR BLOOD PRESSURE SUPPORT)
PREFILLED_SYRINGE | INTRAVENOUS | Status: DC | PRN
  Administered 2023-05-15: 160 ug via INTRAVENOUS

## 2023-05-15 MED ORDER — ORAL CARE MOUTH RINSE: 15.0000 mL | Freq: Once | OROMUCOSAL | Status: AC

## 2023-05-15 MED ORDER — PROPOFOL 10 MG/ML IV BOLUS
INTRAVENOUS | Status: DC | PRN
  Administered 2023-05-15: 200 mg via INTRAVENOUS

## 2023-05-15 MED ORDER — PHENYLEPHRINE HCL-NACL 20-0.9 MG/250ML-% IV SOLN
INTRAVENOUS | Status: DC | PRN
Start: 2023-05-15 — End: 2023-05-15
  Administered 2023-05-15: 25 ug/min via INTRAVENOUS

## 2023-05-15 MED ORDER — FENTANYL CITRATE (PF) 100 MCG/2ML IJ SOLN: 25.0000 ug | INTRAMUSCULAR | Status: DC | PRN

## 2023-05-15 MED ORDER — HYDROCODONE-ACETAMINOPHEN 5-325 MG PO TABS
1.0000 | ORAL_TABLET | ORAL | Status: DC | PRN
  Administered 2023-05-15 (×2): 1 via ORAL
  Administered 2023-05-16: 2 via ORAL
  Filled 2023-05-15: qty 2
  Filled 2023-05-15 (×2): qty 1

## 2023-05-15 MED ORDER — ACETAMINOPHEN 10 MG/ML IV SOLN
INTRAVENOUS | Status: AC
  Filled 2023-05-15: qty 100

## 2023-05-15 MED ORDER — SURGIFLO WITH THROMBIN (HEMOSTATIC MATRIX KIT) OPTIME
TOPICAL | Status: DC | PRN
  Administered 2023-05-15: 1 via TOPICAL

## 2023-05-15 MED ORDER — CHLORHEXIDINE GLUCONATE 0.12 % MT SOLN
15.0000 mL | Freq: Once | OROMUCOSAL | Status: AC
  Administered 2023-05-15: 15 mL via OROMUCOSAL

## 2023-05-15 MED ORDER — HEPARIN SODIUM (PORCINE) 5000 UNIT/ML IJ SOLN
5000.0000 [IU] | INTRAMUSCULAR | Status: AC
  Administered 2023-05-15: 5000 [IU] via SUBCUTANEOUS

## 2023-05-15 MED ORDER — ONDANSETRON HCL 4 MG/2ML IJ SOLN: 4.0000 mg | Freq: Once | INTRAMUSCULAR | Status: DC | PRN

## 2023-05-15 MED ORDER — MIDAZOLAM HCL 2 MG/2ML IJ SOLN
INTRAMUSCULAR | Status: AC
  Filled 2023-05-15: qty 2

## 2023-05-15 MED ORDER — CEFAZOLIN SODIUM-DEXTROSE 2-4 GM/100ML-% IV SOLN
2.0000 g | INTRAVENOUS | Status: AC
  Administered 2023-05-15: 2 g via INTRAVENOUS

## 2023-05-15 MED ORDER — LIDOCAINE HCL (CARDIAC) PF 100 MG/5ML IV SOSY
PREFILLED_SYRINGE | INTRAVENOUS | Status: DC | PRN
  Administered 2023-05-15: 100 mg via INTRAVENOUS

## 2023-05-15 MED ORDER — HYDROCHLOROTHIAZIDE 12.5 MG PO TABS
12.5000 mg | ORAL_TABLET | Freq: Every day | ORAL | Status: DC
  Administered 2023-05-15 – 2023-05-18 (×4): 12.5 mg via ORAL
  Filled 2023-05-15 (×4): qty 1

## 2023-05-15 MED ORDER — ONDANSETRON HCL 4 MG/2ML IJ SOLN
INTRAMUSCULAR | Status: AC
  Filled 2023-05-15: qty 2

## 2023-05-15 MED ORDER — KETAMINE HCL 50 MG/5ML IJ SOSY
PREFILLED_SYRINGE | INTRAMUSCULAR | Status: AC
  Filled 2023-05-15: qty 5

## 2023-05-15 MED ORDER — PROPOFOL 10 MG/ML IV BOLUS
INTRAVENOUS | Status: AC
  Filled 2023-05-15: qty 40

## 2023-05-15 MED ORDER — ROCURONIUM BROMIDE 100 MG/10ML IV SOLN
INTRAVENOUS | Status: DC | PRN
  Administered 2023-05-15: 50 mg via INTRAVENOUS
  Administered 2023-05-15: 10 mg via INTRAVENOUS
  Administered 2023-05-15: 40 mg via INTRAVENOUS

## 2023-05-15 MED ORDER — MICROFIBRILLAR COLL HEMOSTAT EX PADS
MEDICATED_PAD | CUTANEOUS | Status: DC | PRN
  Administered 2023-05-15: 1 via TOPICAL

## 2023-05-15 MED ORDER — DEXAMETHASONE SODIUM PHOSPHATE 10 MG/ML IJ SOLN
INTRAMUSCULAR | Status: DC | PRN
  Administered 2023-05-15: 10 mg via INTRAVENOUS

## 2023-05-15 MED ORDER — MIDAZOLAM HCL 2 MG/2ML IJ SOLN
INTRAMUSCULAR | Status: DC | PRN
  Administered 2023-05-15: 2 mg via INTRAVENOUS

## 2023-05-15 MED ORDER — ONDANSETRON HCL 4 MG/2ML IJ SOLN
INTRAMUSCULAR | Status: DC | PRN
  Administered 2023-05-15: 4 mg via INTRAVENOUS

## 2023-05-15 MED ORDER — DEXAMETHASONE SODIUM PHOSPHATE 10 MG/ML IJ SOLN
INTRAMUSCULAR | Status: AC
  Filled 2023-05-15: qty 1

## 2023-05-15 MED ORDER — BUPIVACAINE HCL 0.5 % IJ SOLN
INTRAMUSCULAR | Status: DC | PRN
  Administered 2023-05-15: 15 mL

## 2023-05-15 MED ORDER — POTASSIUM CHLORIDE CRYS ER 10 MEQ PO TBCR
10.0000 meq | EXTENDED_RELEASE_TABLET | Freq: Every day | ORAL | Status: DC
  Administered 2023-05-15 – 2023-05-18 (×4): 10 meq via ORAL
  Filled 2023-05-15 (×4): qty 1

## 2023-05-15 SURGICAL SUPPLY — 80 items
ADH SKN CLS APL DERMABOND .7 (GAUZE/BANDAGES/DRESSINGS) ×2
AGENT HMST KT MTR STRL THRMB (HEMOSTASIS) ×2
ANCHOR TIS RET SYS 235ML (MISCELLANEOUS) ×2 IMPLANT
APL ESCP 34 STRL LF DISP (HEMOSTASIS) ×2
APPLICATOR SURGIFLO ENDO (HEMOSTASIS) ×2 IMPLANT
BAG DRN RND TRDRP ANRFLXCHMBR (UROLOGICAL SUPPLIES) ×2
BAG TISS RTRVL C235 10X14 (MISCELLANEOUS) ×2
BAG URINE DRAIN 2000ML AR STRL (UROLOGICAL SUPPLIES) ×2 IMPLANT
BLADE CLIPPER SURG (BLADE) ×2 IMPLANT
CATH FOL 2WAY LX 18X5 (CATHETERS) ×2 IMPLANT
CLIP LIGATING HEM O LOK PURPLE (MISCELLANEOUS) ×6 IMPLANT
COVER TIP SHEARS 8 DVNC (MISCELLANEOUS) ×2 IMPLANT
DERMABOND ADVANCED .7 DNX12 (GAUZE/BANDAGES/DRESSINGS) ×2 IMPLANT
DRAIN CHANNEL JP 19F (MISCELLANEOUS) IMPLANT
DRAPE ARM DVNC X/XI (DISPOSABLE) ×8 IMPLANT
DRAPE COLUMN DVNC XI (DISPOSABLE) ×2 IMPLANT
DRAPE LEGGINS SURG 28X43 STRL (DRAPES) ×2 IMPLANT
DRAPE SHEET LG 3/4 BI-LAMINATE (DRAPES) ×2 IMPLANT
DRAPE SURG 17X11 SM STRL (DRAPES) ×8 IMPLANT
DRAPE UNDER BUTTOCK W/FLU (DRAPES) ×2 IMPLANT
DRIVER NDL LRG 8 DVNC XI (INSTRUMENTS) ×2 IMPLANT
DRIVER NDLE LRG 8 DVNC XI (INSTRUMENTS) ×2 IMPLANT
DRSG TELFA 3X8 NADH STRL (GAUZE/BANDAGES/DRESSINGS) ×2 IMPLANT
ELECT REM PT RETURN 9FT ADLT (ELECTROSURGICAL) ×2
ELECTRODE REM PT RTRN 9FT ADLT (ELECTROSURGICAL) ×2 IMPLANT
FORCEPS BPLR 8 MD DVNC XI (FORCEP) ×2 IMPLANT
FORCEPS BPLR FENES DVNC XI (FORCEP) ×2 IMPLANT
FORCEPS PROGRASP DVNC XI (FORCEP) ×2 IMPLANT
GLOVE BIOGEL PI IND STRL 7.5 (GLOVE) ×6 IMPLANT
GOWN STRL REUS W/ TWL LRG LVL3 (GOWN DISPOSABLE) ×4 IMPLANT
GOWN STRL REUS W/ TWL XL LVL3 (GOWN DISPOSABLE) ×4 IMPLANT
GOWN STRL REUS W/TWL LRG LVL3 (GOWN DISPOSABLE) ×4
GOWN STRL REUS W/TWL XL LVL3 (GOWN DISPOSABLE) ×4
GRASPER SUT TROCAR 14GX15 (MISCELLANEOUS) ×2 IMPLANT
HEMOSTAT SURGICEL 2X14 (HEMOSTASIS) IMPLANT
HOLDER FOLEY CATH W/STRAP (MISCELLANEOUS) ×2 IMPLANT
IRRIGATION STRYKERFLOW (MISCELLANEOUS) ×2 IMPLANT
IRRIGATOR STRYKERFLOW (MISCELLANEOUS) ×2
KIT PINK PAD W/HEAD ARE REST (MISCELLANEOUS) ×2
KIT PINK PAD W/HEAD ARM REST (MISCELLANEOUS) ×2 IMPLANT
LABEL OR SOLS (LABEL) ×2 IMPLANT
MANIFOLD NEPTUNE II (INSTRUMENTS) ×2 IMPLANT
NDL HYPO 22X1.5 SAFETY MO (MISCELLANEOUS) ×2 IMPLANT
NDL INSUFFLATION 14GA 120MM (NEEDLE) ×2 IMPLANT
NEEDLE HYPO 22X1.5 SAFETY MO (MISCELLANEOUS) ×2 IMPLANT
NEEDLE INSUFFLATION 14GA 120MM (NEEDLE) ×2 IMPLANT
NS IRRIG 500ML POUR BTL (IV SOLUTION) ×2 IMPLANT
OBTURATOR OPTICAL STND 8 DVNC (TROCAR) ×2
OBTURATOR OPTICALSTD 8 DVNC (TROCAR) ×2 IMPLANT
PACK LAP CHOLECYSTECTOMY (MISCELLANEOUS) ×2 IMPLANT
PENCIL SMOKE EVACUATOR (MISCELLANEOUS) ×2 IMPLANT
RELOAD STAPLE 60 2.6 WHT THN (STAPLE) IMPLANT
RELOAD STAPLER WHITE 60MM (STAPLE) ×2 IMPLANT
SCISSORS MNPLR CVD DVNC XI (INSTRUMENTS) ×2 IMPLANT
SEAL UNIV 5-12 XI (MISCELLANEOUS) ×8 IMPLANT
SET TUBE SMOKE EVAC HIGH FLOW (TUBING) ×2 IMPLANT
SOL ELECTROSURG ANTI STICK (MISCELLANEOUS) ×2
SOLUTION ELECTROSURG ANTI STCK (MISCELLANEOUS) ×2 IMPLANT
SPONGE T-LAP 4X18 ~~LOC~~+RFID (SPONGE) ×2 IMPLANT
STAPLE ECHEON FLEX 60 POW ENDO (STAPLE) IMPLANT
STAPLER RELOAD WHITE 60MM (STAPLE) ×2
STAPLER SKIN PROX 35W (STAPLE) ×2 IMPLANT
STRAP SAFETY 5IN WIDE (MISCELLANEOUS) ×2 IMPLANT
SURGIFLO W/THROMBIN 8M KIT (HEMOSTASIS) ×2 IMPLANT
SURGILUBE 2OZ TUBE FLIPTOP (MISCELLANEOUS) ×2 IMPLANT
SUT ETHILON 3-0 FS-10 30 BLK (SUTURE) ×2
SUT MNCRL 4-0 (SUTURE) ×4
SUT MNCRL 4-0 27XMFL (SUTURE) ×4
SUT STRATA 3-0 15 RB-1 (SUTURE) IMPLANT
SUT STRATA 3-0 15 RB-1.5 (SUTURE) IMPLANT
SUT VIC AB 0 CT1 36 (SUTURE) ×4 IMPLANT
SUT VICRYL 0 UR6 27IN ABS (SUTURE) ×2 IMPLANT
SUTURE EHLN 3-0 FS-10 30 BLK (SUTURE) IMPLANT
SUTURE MNCRL 4-0 27XMF (SUTURE) ×4 IMPLANT
SYR TOOMEY 50ML (SYRINGE) ×2 IMPLANT
TAPE CLOTH 3X10 WHT NS LF (GAUZE/BANDAGES/DRESSINGS) ×4 IMPLANT
TROCAR Z-THREAD FIOS 12X100MM (TROCAR) ×2 IMPLANT
TROCAR Z-THREAD FIOS 5X100MM (TROCAR) ×2 IMPLANT
WATER STERILE IRR 3000ML UROMA (IV SOLUTION) ×2 IMPLANT
WATER STERILE IRR 500ML POUR (IV SOLUTION) ×2 IMPLANT

## 2023-05-15 NOTE — Op Note (Signed)
Date of procedure: 05/15/2023   Preoperative diagnosis:  Intermediate risk prostate cancer   Postoperative diagnosis:  Same   Procedure: Da Vinci robotic radical prostatectomy Bilateral pelvic lymph node dissection   Surgeon: Legrand Rams, MD   Assistant: Vanna Scotland, MD  (An assistant was required for this surgical procedure.  The duties of the assistant included but were not limited to suctioning, passing suture, camera manipulation, retraction. This procedure would not be able to be performed without an assistant)   Anesthesia: General   Complications: None   Intraoperative findings:  1.  Uncomplicated robotic prostatectomy and pelvic LN dissection 2.  Watertight vesicourethral anastomosis, challenging anastomosis secondary to large median lobe   EBL: 150 cc   Specimens:  1.  Prostate and seminal vesicles 2. Right pelvic lymph nodes 3. Left pelvic lymph nodes 4.  Bladder neck margin(median lobe)   Drains: 20 [place] 2-way Foley, 18 [place]   Indication: James Haas is a 72 y.o. male with intermediate risk prostate cancer   We discussed treatment options including surgery or radiation, and he elected for robotic prostatectomy.  After reviewing the management options for treatment, they elected to proceed with the above surgical procedure(s). We have discussed the potential benefits and risks of the procedure, side effects of the proposed treatment, the likelihood of the patient achieving the goals of the procedure, and any potential problems that might occur during the procedure or recuperation. Informed consent has been obtained. We specifically discussed the risks of bleeding, infection, bowel injury, injury to surrounding structures, incontinence, erectile dysfunction, urine leak, possible need for prolonged foley or drain placement, need for long term PSA monitoring, risk of recurrence of disease, and possible need for salvage radiation in the future.    Description of procedure:   The patient was taken to the operating room and general anesthesia was induced. SCDs were placed for DVT prophylaxis, and 5000 units subcutaneous heparin were given.  The patient was placed in the dorsal lithotomy position, carefully padded with the arms against the sides, prepped and draped in the usual sterile fashion, and preoperative antibiotics(Ancef) were administered. A preoperative time-out was performed.    On the field, an 22 [place] Foley catheter passed easily into the bladder with return of clear yellow urine.  An 8 mm incision was made above the umbilicus, and a Veress needle was used to obtain access to the peritoneum.  The abdomen was insufflated to 15 mmHg, and the robotic ports were placed in standard fashion under direct vision. A Carter-Thomason device was used to pre-place an 0-vicryl suture in the right lateral 11mm assistant port under direct vision. Thorough inspection of the abdomen showed no bowel injury or other abnormal findings.  Lidocaine was injected into all port sites prior to placement.  The patient was then placed in steep Trendelenburg position and the robot was docked.   I started by entering the space of Retzius and taking down the bladder.  Once this was free to the vas deferens bilaterally, the periprostatic fat was cleaned off the prostate and sent for permanent.  The endopelvic fascia was opened bilaterally, and muscle fibers spared laterally.  The periprostatic ligaments were carefully taken down.  The DVC was isolated, and ligated using a 60 mm load vascular stapler.    I then turned my attention to the anterior bladder neck, and this was incised down to the level of the Foley catheter.  The Foley catheter was removed from the bladder and gently held upward  for traction.  There appeared to be a small median lobe which was excised.  The posterior bladder neck was carefully divided, and we identified the plane between the prostate and the  seminal vesicles.  The ampulla of the vas was divided bilaterally, and held up for retraction.  The seminal vesicles were then dissected out bilaterally intact.  [place] fascia was opened posteriorly to the prostate and freed laterally. The pedicles were taken down using weck clips, and cautery was minimized. A nerve spare was performed bilaterally. The prostate was then gently retracted superiorly, and the apex was dissected free.  Once the urethra was identified, the use of electrocautery was minimized.  The urethra was sharply entered and the Foley catheter removed.  The posterior urethral plate was divided. The specimen was placed in an Endo Catch bag and moved out of the field.  There was good hemostasis in the pelvis.   A lymph node dissection was then performed. I started on the right side and removed the lymphatic tissue in the obturator fossa with the borders of the external iliac vein laterally, node of [place]et distally, the bladder medially, and the obturator nerve. An identical procedure was performed on the left side. Weck clips were used to clip visualized lymphatics.   A 3-0 V lock stitch was used to reapproximate the tissue behind the bladder and posteriorly to the urethra as described by Rocco in order to remove tension from the anastomosis.  Two 3-0 V lock stitches were connected and used for the urethrovesical anastomosis.  I started performing a running anastomosis but quickly realized there was some residual median lobe tissue.  This was grasped and excised and sent for permanent as bladder neck margin, and the original anastomotic suture was removed.  A new running anastomosis was performed circumferentially and there appeared to be good approximation of the mucosa on both sides.  A new 20 French Foley passed easily into the bladder. With the sutures tied anteriorly and 12cc in the balloon, 120 cc normal saline were instilled with no leakage noted in the operative field.  A anterior  urethral suspension was performed by passing the sutures through the posterior aspect of the pubic bone with gentle tension to aid in return to continence.  There was good hemostasis.  A small piece of Surgicel was placed on either side of the anastomosis along the endopelvic fascia in addition to Surgi-Flo, as well as in the obturator fossa bilaterally.  Secondary to the challenging anastomosis, I opted to leave a drain.  All instruments were removed and the robot was undocked.   The supraumbilical incision was extended superiorly, and the specimen was removed.  The pre-placed fascial suture in the 11mm right lateral assistant port was tied down. The fascia of the small midline incision was closed using a running 0 Vicryl.  All port sites were copiously irrigated.  There was good hemostasis.  A running 4-0 Monocryl was used to close all incisions, and Dermabond was applied.   All counts were correct, and the patient was awakened and taken to PACU in stable condition.   Disposition: Stable to PACU   Plan: Anticipate discharge home tomorrow with catheter in place for 1 week  Legrand Rams, MD 05/15/2023

## 2023-05-15 NOTE — Anesthesia Procedure Notes (Signed)
Procedure Name: Intubation Date/Time: 05/15/2023 11:19 AM  Performed by: Mohammed Kindle, CRNAPre-anesthesia Checklist: Patient identified, Emergency Drugs available, Suction available and Patient being monitored Patient Re-evaluated:Patient Re-evaluated prior to induction Oxygen Delivery Method: Circle system utilized Preoxygenation: Pre-oxygenation with 100% oxygen Induction Type: IV induction Ventilation: Mask ventilation without difficulty Laryngoscope Size: McGraph and 3 Grade View: Grade I Tube type: Oral Number of attempts: 1 Airway Equipment and Method: Stylet and Oral airway Placement Confirmation: ETT inserted through vocal cords under direct vision, positive ETCO2, breath sounds checked- equal and bilateral and CO2 detector Secured at: 21 cm Tube secured with: Tape Dental Injury: Teeth and Oropharynx as per pre-operative assessment

## 2023-05-15 NOTE — H&P (Signed)
05/15/23 10:18 AM   James Haas 1951-03-16 [id number]  CC: Prostate cancer  HPI: Healthy 72 year old male originally diagnosed with low risk prostate cancer in November 2021 for PSA of 5.7, PSA has risen over the last year up to 16.6, and prostate MRI showed a 45 g prostate(PSA density 0.36), with PI-RADS 4 lesion in the left transition zone, and PI-RADS 3 lesion in the right peripheral zone.  He underwent an MRI fusion biopsy on 03/08/2023.  Biopsy showed Gleason score 3+4=7 disease in the PI-RADS 4 ROI, as well as an additional core of 3+4=7 in the left lateral prostate, and 2 additional cores of Gleason score 3+3=6.   He has a moderate-sized median lobe on MRI.  He denies any problems with erections.  Takes Flomax for weak stream.  He opted for robotic radical prostatectomy.  PMH: Past Medical History:  Diagnosis Date   Dyspepsia    Dysplastic nevi    Elevated PSA    GERD (gastroesophageal reflux disease)    Gout    Hiatal hernia    History of basal cell carcinoma of skin    Hyperglycemia, unspecified    Hyperlipemia    Hypertension    Olecranon bursitis    Pure hypercholesterolemia     Surgical History: Past Surgical History:  Procedure Laterality Date   ESOPHAGOGASTRODUODENOSCOPY (EGD) WITH PROPOFOL N/A 02/16/2018   Procedure: ESOPHAGOGASTRODUODENOSCOPY (EGD) WITH biopsy;  Surgeon: Midge Minium, MD;  Location: [facility];  Service: Endoscopy;  Laterality: N/A;  prefers later appt   GANGLION CYST EXCISION Left    TONSILLECTOMY     as child   WISDOM TOOTH EXTRACTION       Family History: Family History  Problem Relation Age of Onset   Bladder Cancer Neg Hx    Kidney cancer Neg Hx    Prostate cancer Neg Hx     Social History:  reports that he has never smoked. He has never been exposed to tobacco smoke. He has never used smokeless tobacco. He reports current alcohol use of about 10.0 standard drinks of alcohol per week. He reports that he does  not use drugs.  Physical Exam: BP (!) 145/91   Pulse 77   Temp (!) 97.1 F (36.2 C) (Temporal)   Resp 16   SpO2 99%    Constitutional:  Alert and oriented, No acute distress. Cardiovascular: Regular rate and rhythm Respiratory: Clear to auscultation bilaterally GI: Abdomen is soft, nontender, nondistended, no abdominal masses  Assessment & Plan:   72 year old male with intermediate risk prostate cancer who opted for robotic radical prostatectomy and lymph node dissection.  We had a lengthy conversation today about the patient's new diagnosis of prostate cancer.  We reviewed the risk classifications per the AUA guidelines including very low risk, low risk, intermediate risk, and high risk disease, and the need for additional staging imaging with CT and bone scan in patients with unfavorable intermediate risk and high risk disease.  I explained that his life expectancy, clinical stage, Gleason score, PSA, and other co-morbidities influence treatment strategies.  We discussed the roles of active surveillance, radiation therapy, surgical therapy with robotic prostatectomy, and hormone therapy with androgen deprivation.  We discussed that patients urinary symptoms also impact treatment strategy, as patients with severe lower urinary tract symptoms may have significant worsening or even develop urinary retention after undergoing radiation.  In regards to surgery, we discussed robotic prostatectomy +/- lymphadenectomy at length.  The procedure takes 3 to 4 hours,  and patient's typically discharge home on post-op day #1.  A Foley catheter is left in place for 7 to 10 days to allow for healing of the vesicourethral anastomosis.  There is a small risk of bleeding, infection, damage to surrounding structures or bowel, hernia, DVT/PE, or serious cardiac or pulmonary complications.  We discussed at length post-op side effects including erectile dysfunction, and the importance of pre-operative erectile  function on long-term outcomes.  Even with a nerve sparing approach, there is an approximately 25% rate of permanent erectile dysfunction.  We also discussed postop urinary incontinence at length.  We expect patients to have stress incontinence post-operatively that will improve over period of weeks to months.  Less than 10% of men will require a pad at 1 year after surgery.  Patients will need to avoid heavy lifting and strenuous activity for 3 to 4 weeks, but most men return to their baseline activity status by 6 weeks.  Robotic radical prostatectomy and bilateral pelvic lymph node dissection today   Legrand Rams, MD 05/15/2023  [facility] [address] [place], [place] [address] [phone]

## 2023-05-15 NOTE — Anesthesia Postprocedure Evaluation (Signed)
Anesthesia Post Note  Patient: Deveron Furlong III  Procedure(s) Performed: XI ROBOTIC ASSISTED LAPAROSCOPIC RADICAL PROSTATECTOMY PELVIC LYMPH NODE DISSECTION (Bilateral)  Patient location during evaluation: PACU Anesthesia Type: General Level of consciousness: awake and alert Pain management: pain level controlled Vital Signs Assessment: post-procedure vital signs reviewed and stable Respiratory status: spontaneous breathing, nonlabored ventilation, respiratory function stable and patient connected to nasal cannula oxygen Cardiovascular status: blood pressure returned to baseline and stable Postop Assessment: no apparent nausea or vomiting Anesthetic complications: no   No notable events documented.   Last Vitals:  Vitals:   05/15/23 1500 05/15/23 1515  BP: 116/78 104/80  Pulse: 62 63  Resp: 14 13  Temp:    SpO2: 96% 95%    Last Pain:  Vitals:   05/15/23 1515  TempSrc:   PainSc: 0-No pain                 Cleda Mccreedy Lithzy Bernard

## 2023-05-15 NOTE — Anesthesia Preprocedure Evaluation (Signed)
Anesthesia Evaluation  Patient identified by MRN, date of birth, ID band Patient awake    Reviewed: Allergy & Precautions, NPO status , Patient's Chart, lab work & pertinent test results  Airway Mallampati: III  TM Distance: >3 FB Neck ROM: full    Dental  (+) Teeth Intact   Pulmonary neg pulmonary ROS   Pulmonary exam normal breath sounds clear to auscultation       Cardiovascular Exercise Tolerance: Good hypertension, Pt. on medications negative cardio ROS Normal cardiovascular exam Rhythm:Regular Rate:Normal     Neuro/Psych negative neurological ROS  negative psych ROS   GI/Hepatic negative GI ROS, Neg liver ROS, hiatal hernia,GERD  Medicated,,  Endo/Other  negative endocrine ROS  Morbid obesity  Renal/GU negative Renal ROS     Musculoskeletal negative musculoskeletal ROS (+)    Abdominal  (+) + obese  Peds negative pediatric ROS (+)  Hematology negative hematology ROS (+)   Anesthesia Other Findings Past Medical History: No date: Dyspepsia No date: Dysplastic nevi No date: Elevated PSA No date: GERD (gastroesophageal reflux disease) No date: Gout No date: Hiatal hernia No date: History of basal cell carcinoma of skin No date: Hyperglycemia, unspecified No date: Hyperlipemia No date: Hypertension No date: Olecranon bursitis No date: Pure hypercholesterolemia  Past Surgical History: 02/16/2018: ESOPHAGOGASTRODUODENOSCOPY (EGD) WITH PROPOFOL; N/A     Comment:  Procedure: ESOPHAGOGASTRODUODENOSCOPY (EGD) WITH biopsy;              Surgeon: Midge Minium, MD;  Location: [facility];  Service: Endoscopy;  Laterality: N/A;  prefers               later appt No date: GANGLION CYST EXCISION; Left No date: TONSILLECTOMY     Comment:  as child No date: WISDOM TOOTH EXTRACTION     Reproductive/Obstetrics negative OB ROS                             Anesthesia  Physical Anesthesia Plan  ASA: 2  Anesthesia Plan: General   Post-op Pain Management:    Induction: Intravenous  PONV Risk Score and Plan: Ondansetron, Dexamethasone, Midazolam and Treatment may vary due to age or medical condition  Airway Management Planned: Oral ETT  Additional Equipment:   Intra-op Plan:   Post-operative Plan: Extubation in OR  Informed Consent: I have reviewed the patients History and Physical, chart, labs and discussed the procedure including the risks, benefits and alternatives for the proposed anesthesia with the patient or authorized representative who has indicated his/her understanding and acceptance.     Dental Advisory Given  Plan Discussed with: CRNA and Surgeon  Anesthesia Plan Comments:        Anesthesia Quick Evaluation

## 2023-05-15 NOTE — Transfer of Care (Addendum)
Immediate Anesthesia Transfer of Care Note  Patient: James Haas  Procedure(s) Performed: XI ROBOTIC ASSISTED LAPAROSCOPIC RADICAL PROSTATECTOMY PELVIC LYMPH NODE DISSECTION (Bilateral)  Patient Location: PACU  Anesthesia Type:General  Level of Consciousness: awake, drowsy, and patient cooperative  Airway & Oxygen Therapy: Patient Spontanous Breathing and Patient connected to face mask oxygen  Post-op Assessment: Report given to RN and Post -op Vital signs reviewed and stable  Post vital signs: Reviewed and stable  Last Vitals:  Vitals Value Taken Time  BP 108/56 05/15/23 1456  Temp 36.7 C 05/15/23 1456  Pulse 62 05/15/23 1459  Resp 14 05/15/23 1459  SpO2 96 % 05/15/23 1459  Vitals shown include unfiled device data.  Last Pain:  Vitals:   05/15/23 0908  TempSrc: Temporal  PainSc: 0-No pain         Complications: No notable events documented.

## 2023-05-16 ENCOUNTER — Encounter: Payer: Self-pay | Admitting: Urology

## 2023-05-16 DIAGNOSIS — C61 Malignant neoplasm of prostate: Secondary | ICD-10-CM

## 2023-05-16 LAB — CBC
HCT: 37.4 % — ABNORMAL LOW (ref 39.0–52.0)
Hemoglobin: 13.4 g/dL (ref 13.0–17.0)
MCH: 32.4 pg (ref 26.0–34.0)
MCHC: 35.8 g/dL (ref 30.0–36.0)
MCV: 90.6 fL (ref 80.0–100.0)
Platelets: 259 10*3/uL (ref 150–400)
RBC: 4.13 MIL/uL — ABNORMAL LOW (ref 4.22–5.81)
RDW: 12.6 % (ref 11.5–15.5)
WBC: 10.4 10*3/uL (ref 4.0–10.5)
nRBC: 0 % (ref 0.0–0.2)

## 2023-05-16 LAB — BASIC METABOLIC PANEL
Anion gap: 7 (ref 5–15)
BUN: 15 mg/dL (ref 8–23)
CO2: 25 mmol/L (ref 22–32)
Calcium: 8.8 mg/dL — ABNORMAL LOW (ref 8.9–10.3)
Chloride: 102 mmol/L (ref 98–111)
Creatinine, Ser: 0.81 mg/dL (ref 0.61–1.24)
GFR, Estimated: >60 mL/min (ref >60)
Glucose, Bld: 121 mg/dL — ABNORMAL HIGH (ref 70–99)
Potassium: 3.7 mmol/L (ref 3.5–5.1)
Sodium: 134 mmol/L — ABNORMAL LOW (ref 135–145)

## 2023-05-16 MED ORDER — OXYCODONE HCL 5 MG PO TABS
5.0000 mg | ORAL_TABLET | ORAL | Status: DC | PRN
  Administered 2023-05-16: 5 mg via ORAL
  Administered 2023-05-16 – 2023-05-17 (×2): 10 mg via ORAL
  Administered 2023-05-17: 5 mg via ORAL
  Filled 2023-05-16: qty 1
  Filled 2023-05-16 (×2): qty 2
  Filled 2023-05-16: qty 1

## 2023-05-16 MED ORDER — CHLORHEXIDINE GLUCONATE CLOTH 2 % EX PADS
6.0000 | MEDICATED_PAD | Freq: Every day | CUTANEOUS | Status: DC
  Administered 2023-05-16 – 2023-05-18 (×3): 6 via TOPICAL

## 2023-05-16 NOTE — Progress Notes (Signed)
Urology Inpatient Progress Note  Subjective: No acute events overnight.  He has been afebrile, VSS. Hemoglobin down, 13.4.  WBC count up, 10.4.  Creatinine stable, 0.81. LLQ JP drain in place draining serosanguineous fluid.  165 mL drain output overnight. Foley catheter in place draining clear, yellow urine. He reports his pain is rather well-controlled this morning.  He has not yet ambulated.  He has passed flatus.  He does not have an incentive spirometer.  Diet has been advanced, but he has not eaten yet.  As of this afternoon, he has ambulated and been using incentive spirometry.  He is tolerating p.o., however his pain has been intermittently uncontrolled.  Anti-infectives: Anti-infectives (From admission, onward)    Start     Dose/Rate Route Frequency Ordered Stop   05/15/23 0902  ceFAZolin (ANCEF) IVPB 2g/100 mL premix        2 g 200 mL/hr over 30 Minutes Intravenous 30 min pre-op 05/15/23 0902 05/15/23 1121       Current Facility-Administered Medications  Medication Dose Route Frequency Provider Last Rate Last Admin   acetaminophen (TYLENOL) tablet 1,000 mg  1,000 mg Oral Q6H Sondra Come, MD   1,000 mg at 05/16/23 0605   Chlorhexidine Gluconate Cloth 2 % PADS 6 each  6 each Topical Daily Sondra Come, MD   6 each at 05/16/23 1000   docusate sodium (COLACE) capsule 100 mg  100 mg Oral BID Legrand Rams C, MD   100 mg at 05/16/23 0917   heparin injection 5,000 Units  5,000 Units Subcutaneous Q12H Sondra Come, MD   5,000 Units at 05/16/23 8657   hydrochlorothiazide (HYDRODIURIL) tablet 12.5 mg  12.5 mg Oral Daily Legrand Rams C, MD   12.5 mg at 05/16/23 0917   HYDROmorphone (DILAUDID) injection 0.5-1 mg  0.5-1 mg Intravenous Q2H PRN Sondra Come, MD   1 mg at 05/16/23 1403   lisinopril (ZESTRIL) tablet 40 mg  40 mg Oral Daily Sondra Come, MD   40 mg at 05/16/23 0917   ondansetron (ZOFRAN) injection 4 mg  4 mg Intravenous Q4H PRN Sondra Come, MD   4  mg at 05/16/23 1404   oxybutynin (DITROPAN) tablet 5 mg  5 mg Oral Q8H PRN Sondra Come, MD   5 mg at 05/16/23 1330   oxyCODONE (Oxy IR/ROXICODONE) immediate release tablet 5-10 mg  5-10 mg Oral Q4H PRN Carman Ching, PA-C   5 mg at 05/16/23 1330   pantoprazole (PROTONIX) EC tablet 40 mg  40 mg Oral PRN Sondra Come, MD       potassium chloride (KLOR-CON M) CR tablet 10 mEq  10 mEq Oral Daily Legrand Rams C, MD   10 mEq at 05/16/23 0917   rosuvastatin (CRESTOR) tablet 5 mg  5 mg Oral Daily Sondra Come, MD   5 mg at 05/16/23 8469     Objective: Vital signs in last 24 hours: Temp:  [97.8 F (36.6 C)-99.8 F (37.7 C)] 98.1 F (36.7 C) (10/08 1537) Pulse Rate:  [69-78] 69 (10/08 1537) Resp:  [16-18] 18 (10/08 1537) BP: (81-132)/(60-86) 81/60 (10/08 1537) SpO2:  [91 %-98 %] 91 % (10/08 1537)  Intake/Output from previous day: 10/07 0701 - 10/08 0700 In: 1900 [I.V.:1700; IV Piggyback:200] Out: 1040 [Urine:875; Drains:165] Intake/Output this shift: Total I/O In: 0  Out: 190 [Urine:110; Drains:80]  Physical Exam Vitals and nursing note reviewed.  Constitutional:      General: He is not in acute  distress.    Appearance: He is not ill-appearing, toxic-appearing or diaphoretic.  HENT:     Head: Normocephalic and atraumatic.  Pulmonary:     Effort: Pulmonary effort is normal. No respiratory distress.  Abdominal:     Comments: Abdomen is soft with appropriate postoperative tenderness.  No rigidity or rebound.  Incisions clean, dry, and intact with overlying surgical adhesive.  LLQ JP drain in place.  Skin:    General: Skin is warm and dry.  Neurological:     Mental Status: He is alert and oriented to person, place, and time.  Psychiatric:        Mood and Affect: Mood normal.        Behavior: Behavior normal.    Lab Results:  Recent Labs    05/16/23 0523  WBC 10.4  HGB 13.4  HCT 37.4*  PLT 259   BMET Recent Labs    05/16/23 0523  NA 134*  K 3.7   CL 102  CO2 25  GLUCOSE 121*  BUN 15  CREATININE 0.81  CALCIUM 8.8*   Assessment & Plan: 72 year old male POD 1 from robotic assisted laparoscopic radical prostatectomy with bilateral pelvic lymph node dissection with Dr. Richardo Hanks for management of prostate cancer.  Anticipated postoperative changes on a.m. labs.  Abdominal exam with no significant findings.  He has met discharge goals pertaining to diet and ambulation, however we will plan to keep him overnight for pain control.  Will continue to monitor drain output and consider removal tomorrow morning.  If he has significant output overnight, may consider discharge with JP drain in place with plans for outpatient removal later this week.  Carman Ching, PA-C 05/16/2023

## 2023-05-16 NOTE — Progress Notes (Signed)
RN provided patient and spouse education on foley catheter care; how to empty foley catheter and change between standard bag to leg bag. Leg bag placed in patients bathroom.   Madie Reno, RN

## 2023-05-16 NOTE — Care Management Obs Status (Signed)
MEDICARE OBSERVATION STATUS NOTIFICATION   Patient Details  Name: COADY TRAIN MRN: [id number] Date of Birth: 05-10-1951   Medicare Observation Status Notification Given:  Yes    Chapman Fitch, RN 05/16/2023, 4:47 PM

## 2023-05-16 NOTE — TOC CM/SW Note (Signed)
Transition of Care [facility]) - Inpatient Brief Assessment   Patient Details  Name: James Haas MRN: [id number] Date of Birth: 02-Jan-1951  Transition of Care [facility]) CM/SW Contact:    Chapman Fitch, RN Phone Number: 05/16/2023, 9:11 AM   Clinical Narrative:   Transition of Care [facility]) Screening Note   Patient Details  Name: James Haas Date of Birth: 1950/12/31   Transition of Care [facility]) CM/SW Contact:    Chapman Fitch, RN Phone Number: 05/16/2023, 9:12 AM    Transition of Care Department [facility]) has reviewed patient and no TOC needs have been identified at this time. If new patient transition needs arise, please place a TOC consult.    Transition of Care Asessment: Insurance and Status: Insurance coverage has been reviewed Patient has primary care physician: Yes     Prior/Current Home Services: No current home services Social Determinants of Health Reivew: SDOH reviewed no interventions necessary Readmission risk has been reviewed: Yes Transition of care needs: no transition of care needs at this time

## 2023-05-16 NOTE — Plan of Care (Signed)
  Problem: Pain Management: Goal: General experience of comfort will improve Outcome: Progressing   Problem: Skin Integrity: Goal: Demonstration of wound healing without infection will improve Outcome: Progressing   Problem: Urinary Elimination: Goal: Ability to avoid or minimize complications of infection will improve Outcome: Progressing   Problem: Activity: Goal: Risk for activity intolerance will decrease Outcome: Progressing   Problem: Nutrition: Goal: Adequate nutrition will be maintained Outcome: Progressing

## 2023-05-16 NOTE — Progress Notes (Signed)
RN attempted to ambulate patient. Patient unfortunately was not able to ambulate, dangle at edge of bed, or even roll onto side, due to sudden onset of severe pain and nausea. PRN pain and antiemetic medications given which were effective. Encouraged patient to periodically shift body positions in bed to familiarize self with postop pain.   Madie Reno, RN

## 2023-05-17 MED ORDER — ONDANSETRON HCL 4 MG PO TABS: 4.0000 mg | ORAL_TABLET | Freq: Three times a day (TID) | ORAL | 0 refills | Status: AC | PRN

## 2023-05-17 MED ORDER — SENNOSIDES-DOCUSATE SODIUM 8.6-50 MG PO TABS: 1.0000 | ORAL_TABLET | Freq: Every day | ORAL | 0 refills | Status: DC

## 2023-05-17 MED ORDER — OXYBUTYNIN CHLORIDE 5 MG PO TABS: 5.0000 mg | ORAL_TABLET | Freq: Three times a day (TID) | ORAL | 0 refills | Status: DC | PRN

## 2023-05-17 MED ORDER — OXYCODONE HCL 5 MG PO TABS: 5.0000 mg | ORAL_TABLET | ORAL | 0 refills | Status: DC | PRN

## 2023-05-17 MED ORDER — DOCUSATE SODIUM 100 MG PO CAPS: 100.0000 mg | ORAL_CAPSULE | Freq: Two times a day (BID) | ORAL | 0 refills | Status: AC

## 2023-05-17 NOTE — Discharge Summary (Addendum)
Date of admission: 05/15/2023  Date of discharge: 05/18/2023  Admission diagnosis: Prostate cancer  Discharge diagnosis: Prostate cancer  Secondary diagnoses:  Patient Active Problem List   Diagnosis Date Noted   Prostate cancer (HCC) 05/15/2023   Elevated PSA 10/09/2019   Dyspepsia    Essential hypertension 01/08/2018   Dysplastic nevi 01/08/2018   Pure hypercholesterolemia 01/08/2018   History of basal cell carcinoma of skin 11/10/2016   Olecranon bursitis 11/07/2016   Gout 04/18/2013   Hyperglycemia, unspecified 11/03/2011   Hiatal hernia 10/27/2011   History and Physical: For full details, please see admission history and physical. Briefly, James Haas is a 72 y.o. year old patient with intermediate risk prostate cancer underwent a robotic assisted laparoscopic radical prostatectomy with bilateral pelvic node dissection on October 7th, 2024.   Drain creatinine consistent with serous fluid, 1.1.  LLQ JP drain removed at the bedside, patient tolerated well.  He is maintaining adequate O2 sats with ambulation and ready for discharge.  He has not required p.o. pain meds in over 24 hours.  Hospital Course: Patient tolerated the procedure well.  He was then transferred to the floor after an uneventful PACU stay.  His hospital course was uncomplicated.  On POD#3 he had met discharge criteria: was eating a regular diet, was up and ambulating independently, catheter was draining well, JP drain was removed, pain was well controlled, and was ready to for discharge.  Laboratory values:  Recent Labs    05/16/23 0523  WBC 10.4  HGB 13.4  HCT 37.4*   Recent Labs    05/16/23 0523  NA 134*  K 3.7  CL 102  CO2 25  GLUCOSE 121*  BUN 15  CREATININE 0.81  CALCIUM 8.8*   Results for orders placed or performed during the hospital encounter of 05/15/23  Urine Culture     Status: None   Collection Time: 05/11/23  3:09 PM   Specimen: Urine, Clean Catch  Result Value Ref Range  Status   Specimen Description   Final    URINE, CLEAN CATCH Performed at [facility], [address] [place], [place] [address]    Special Requests   Final    NONE Performed at [facility], [address], [place], [place] [address]    Culture   Final    NO GROWTH Performed at [facility], [address] [place], [place] [address]    Report Status 05/13/2023 FINAL  Final    Disposition: Home  Discharge instruction: The patient was instructed to be ambulatory but told to refrain from heavy lifting, strenuous activity, or driving.   Foley care instructions provided by nursing.  Discharge medications:  Allergies as of 05/18/2023   No Known Allergies      Medication List     STOP taking these medications    hydrochlorothiazide 12.5 MG tablet Commonly known as: HYDRODIURIL       TAKE these medications    aspirin EC 81 MG tablet Take 81 mg by mouth daily.   chlorthalidone 25 MG tablet Commonly known as: HYGROTON Take 25 mg by mouth daily.   docusate sodium 100 MG capsule Commonly known as: COLACE Take 1 capsule (100 mg total) by mouth 2 (two) times daily.   ibuprofen 400 MG tablet Commonly known as: ADVIL Take 400 mg by mouth every 6 (six) hours as needed for moderate pain.   Klor-Con M10 10 MEQ tablet Generic drug: potassium chloride Take 10 mEq by  mouth daily.   lisinopril 40 MG tablet Commonly known as: ZESTRIL Take 40 mg by mouth daily.   multivitamin tablet Take 1 tablet by mouth daily.   Omega-3 1000 MG Caps Take 1 capsule by mouth daily in the afternoon.   ondansetron 4 MG tablet Commonly known as: Zofran Take 1 tablet (4 mg total) by mouth every 8 (eight) hours as needed for nausea or vomiting.   oxybutynin 5 MG tablet Commonly known as: DITROPAN Take 1 tablet (5 mg total) by mouth every 8 (eight) hours as needed for bladder spasms.   oxyCODONE 5 MG immediate release tablet Commonly known as: Oxy  IR/ROXICODONE Take 1 tablet (5 mg total) by mouth every 4 (four) hours as needed for moderate pain.   pantoprazole 40 MG tablet Commonly known as: PROTONIX Take 1 tablet by mouth as needed (indigestion).   rosuvastatin 5 MG tablet Commonly known as: CRESTOR Take 5 mg by mouth daily.   senna-docusate 8.6-50 MG tablet Commonly known as: Senokot-S Take 1 tablet by mouth at bedtime.   tamsulosin 0.4 MG Caps capsule Commonly known as: FLOMAX Take 0.4 mg by mouth daily.        Followup:   Follow-up Information     Carman Ching, [place]. Go on 05/22/2023.   Specialty: Urology Why: For catheter removal Contact information: [address] [place] [address] [phone]

## 2023-05-17 NOTE — Plan of Care (Signed)

## 2023-05-17 NOTE — Progress Notes (Signed)
Nursing contacted [place] this evening stated that his sats dropped to 78 after ambulating.  Dr. Richardo Hanks instructed nursing staff to encourage patient to start using his incentive spirometer and continue to monitor.

## 2023-05-17 NOTE — Progress Notes (Signed)
Patient stated that he would need another night because he did not feel his pain was adequately controlled.  So we will go ahead and let him stay the night and reevaluate him in the morning.

## 2023-05-17 NOTE — Discharge Instructions (Signed)
·   Activity:  You are encouraged to ambulate frequently (about every hour during waking hours) to help prevent blood clots from forming in your legs or lungs.  However, you should not engage in any heavy lifting (> 5-10 lbs), strenuous activity, or straining. ° °· Diet: You should advance your diet as instructed by your physician.  It will be normal to have some bloating, nausea, and abdominal discomfort intermittently. ° °· Prescriptions:  You will be provided a prescription for pain medication to take as needed.  If your pain is not severe enough to require the prescription pain medication, you may take extra strength Tylenol instead which will have less side effects.  You should also take a prescribed stool softener to avoid straining with bowel movements as the prescription pain medication may constipate you. ° °· Incisions: You may remove your dressing bandages 48 hours after surgery if not removed in the hospital.  You will either have some small staples or special tissue glue at each of the incision sites. Once the bandages are removed (if present), the incisions may stay open to air.  You may start showering (but not soaking or bathing in water) the 2nd day after surgery and the incisions simply need to be patted dry after the shower.  No additional care is needed. ° °What to call us about: You should call the office if you develop fever > 101 or develop persistent vomiting, redness or draining around your incision, or any other concerning symptoms.   ° °[facility] °[address], [address] °[place], [place] [address] °[phone] ° ° °

## 2023-05-17 NOTE — Progress Notes (Signed)
Urology Consult Follow Up  Subjective: No acute events overnight.  He is afebrile, but having low O2 sats likely due to his hesitancy to take deep breaths because of pain.   He also has not been using his incentive spirometer.  Left lower quadrant JP drain in place draining serosanguineous fluid with 110 mL of drain output overnight.  Foley catheter in place draining clear, yellow urine with 600 cc in the night bag this morning.  He has not ambulated since yesterday afternoon.  He is tolerating p.o.     Anti-infectives: Anti-infectives (From admission, onward)    Start     Dose/Rate Route Frequency Ordered Stop   05/15/23 0902  ceFAZolin (ANCEF) IVPB 2g/100 mL premix        2 g 200 mL/hr over 30 Minutes Intravenous 30 min pre-op 05/15/23 0902 05/15/23 1121       Current Facility-Administered Medications  Medication Dose Route Frequency Provider Last Rate Last Admin   Chlorhexidine Gluconate Cloth 2 % PADS 6 each  6 each Topical Daily Sondra Come, MD   6 each at 05/16/23 1000   docusate sodium (COLACE) capsule 100 mg  100 mg Oral BID Sondra Come, MD   100 mg at 05/16/23 2021   heparin injection 5,000 Units  5,000 Units Subcutaneous Q12H Sondra Come, MD   5,000 Units at 05/16/23 2023   hydrochlorothiazide (HYDRODIURIL) tablet 12.5 mg  12.5 mg Oral Daily Legrand Rams C, MD   12.5 mg at 05/16/23 0917   HYDROmorphone (DILAUDID) injection 0.5-1 mg  0.5-1 mg Intravenous Q2H PRN Sondra Come, MD   1 mg at 05/17/23 0437   lisinopril (ZESTRIL) tablet 40 mg  40 mg Oral Daily Sondra Come, MD   40 mg at 05/16/23 0917   ondansetron (ZOFRAN) injection 4 mg  4 mg Intravenous Q4H PRN Sondra Come, MD   4 mg at 05/16/23 2115   oxybutynin (DITROPAN) tablet 5 mg  5 mg Oral Q8H PRN Sondra Come, MD   5 mg at 05/16/23 2126   oxyCODONE (Oxy IR/ROXICODONE) immediate release tablet 5-10 mg  5-10 mg Oral Q4H PRN Carman Ching, PA-C   10 mg at 05/17/23 0143    pantoprazole (PROTONIX) EC tablet 40 mg  40 mg Oral PRN Sondra Come, MD       potassium chloride (KLOR-CON M) CR tablet 10 mEq  10 mEq Oral Daily Sondra Come, MD   10 mEq at 05/16/23 0917   rosuvastatin (CRESTOR) tablet 5 mg  5 mg Oral Daily Sondra Come, MD   5 mg at 05/16/23 6644     Objective: Vital signs in last 24 hours: Temp:  [98.1 F (36.7 C)-99.8 F (37.7 C)] 98.6 F (37 C) (10/09 0335) Pulse Rate:  [69-86] 86 (10/09 0335) Resp:  [16-18] 16 (10/09 0335) BP: (81-119)/(60-67) 119/67 (10/09 0335) SpO2:  [87 %-93 %] 87 % (10/09 0335)  Intake/Output from previous day: 10/08 0701 - 10/09 0700 In: 240 [P.O.:240] Out: 230 [Urine:120; Drains:110] Intake/Output this shift: No intake/output data recorded.   Physical Exam Vitals and nursing note reviewed.  Constitutional:      General: He is not in acute distress.    Appearance: Normal appearance. He is not ill-appearing, toxic-appearing or diaphoretic.  HENT:     Head: Normocephalic and atraumatic.     Nose: Nose normal.     Mouth/Throat:     Mouth: Mucous membranes are dry.  Eyes:  Extraocular Movements: Extraocular movements intact.     Conjunctiva/sclera: Conjunctivae normal.     Pupils: Pupils are equal, round, and reactive to light.  Pulmonary:     Effort: Pulmonary effort is normal.  Abdominal:     Comments: Abdomen soft mildly distended, incisions are clean and dry, tenderness appropriate for postoperative state.  LLQ JP drain in place with serosanguineous fluid.  Genitourinary:    Penis: Normal.      Comments: Foley in place draining clear yellow urine Musculoskeletal:        General: Normal range of motion.     Cervical back: Normal range of motion.  Skin:    General: Skin is warm.  Neurological:     Mental Status: He is alert and oriented to person, place, and time.  Psychiatric:        Mood and Affect: Mood normal.     Lab Results:  Recent Labs    05/16/23 0523  WBC 10.4  HGB  13.4  HCT 37.4*  PLT 259   BMET Recent Labs    05/16/23 0523  NA 134*  K 3.7  CL 102  CO2 25  GLUCOSE 121*  BUN 15  CREATININE 0.81  CALCIUM 8.8*   PT/INR No results for input(s): "LABPROT", "INR" in the last 72 hours. ABG No results for input(s): "PHART", "HCO3" in the last 72 hours.  Invalid input(s): "PCO2", "PO2"  Studies/Results: No results found.   Assessment and Plan: 72 year old male postop day #2 from robotic assisted laparoscopic radical prostatectomy with bilateral pelvic node dissection with Dr. Richardo Hanks for management of intermediate risk prostate cancer.  Goals of care today will be to get patient up and ambulating.  Have adequate pain control with p.o. pain medication.  Encourage incentive spirometer use to increase O2 sats and prevent atelectasis  Will continue to monitor drain output throughout the morning and may consider removal in the afternoon.  If his JP output remains significant we may need to discharge him with JP drain in place with plans for outpatient removal later this week    LOS: 0 days    [facility] 05/17/2023

## 2023-05-18 DIAGNOSIS — C61 Malignant neoplasm of prostate: Secondary | ICD-10-CM | POA: Diagnosis present

## 2023-05-18 DIAGNOSIS — I10 Essential (primary) hypertension: Secondary | ICD-10-CM | POA: Diagnosis present

## 2023-05-18 DIAGNOSIS — J9589 Other postprocedural complications and disorders of respiratory system, not elsewhere classified: Secondary | ICD-10-CM | POA: Diagnosis not present

## 2023-05-18 DIAGNOSIS — Z85828 Personal history of other malignant neoplasm of skin: Secondary | ICD-10-CM | POA: Diagnosis not present

## 2023-05-18 DIAGNOSIS — E7800 Pure hypercholesterolemia, unspecified: Secondary | ICD-10-CM | POA: Diagnosis present

## 2023-05-18 DIAGNOSIS — Z6828 Body mass index (BMI) 28.0-28.9, adult: Secondary | ICD-10-CM | POA: Diagnosis not present

## 2023-05-18 DIAGNOSIS — R3912 Poor urinary stream: Secondary | ICD-10-CM | POA: Diagnosis present

## 2023-05-18 LAB — CREATININE, FLUID (PLEURAL, PERITONEAL, JP DRAINAGE): Creat, Fluid: 1.1 mg/dL

## 2023-05-18 NOTE — Progress Notes (Signed)
Mobility Specialist - Progress Note   05/18/23 1000  Mobility  Activity Ambulated with assistance in hallway;Dangled on edge of bed  Level of Assistance Standby assist, set-up cues, supervision of patient - no hands on  Assistive Device Front wheel walker  Distance Ambulated (ft) 180 ft  Activity Response Tolerated well  $Mobility charge 1 Mobility    Pre mobility: 71 HR, 96% SpO2 During mobility: 82 HR, 94% SpO2 Post mobility: 77 HR, 97% SpO2  Pt lying in bed upon arrival, utilizing 2L. Pt agreeable to activity. Reports mild pain at incision site 3/10. Completed bed mobility with min-modA. STS with modA. No dizziness. Pt ambulated in hallway with minG + RW. Very mildly unsteady, but no LOB. Mild SOB during ambulation. O2 maintained > 94% throughout session (including 80' while on RA). Fatigued post-activity. Pt sat EOB for ~ 5 minutes during recovery. MinA on LE to return supine via log-roll. Pt left in bed with alarm set, needs in reach. Returned to 2L. RN notified.   Filiberto Pinks Mobility Specialist 05/18/23, 10:58 AM

## 2023-05-18 NOTE — Progress Notes (Signed)
SATURATION QUALIFICATIONS: (This note is used to comply with regulatory documentation for home oxygen) ° °Patient Saturations on Room Air at Rest = 99% ° °Patient Saturations on Room Air while Ambulating = 96% ° °

## 2023-05-18 NOTE — Progress Notes (Addendum)
Urology Inpatient Progress Note  Subjective: No acute events overnight.  He is afebrile, VSS. 270 mL JP drain output in the last 24 hours.  P drain in place draining serosanguineous fluid.  Foley catheter in place draining clear, yellow urine. He is lying flat in bed and reports no BMs yet, however he continues to pass flatus.  He is very concerned about his lack of BMs, but admits that he has not been eating very much over the past couple of days.  He admits that he is not ambulating or using his incentive spirometer is much as he should. Is oxygenating well on 3 L nasal cannula.  He denies chest pain, palpitations, or shortness of breath.  Anti-infectives: Anti-infectives (From admission, onward)    Start     Dose/Rate Route Frequency Ordered Stop   05/15/23 0902  ceFAZolin (ANCEF) IVPB 2g/100 mL premix        2 g 200 mL/hr over 30 Minutes Intravenous 30 min pre-op 05/15/23 0902 05/15/23 1121       Current Facility-Administered Medications  Medication Dose Route Frequency Provider Last Rate Last Admin   Chlorhexidine Gluconate Cloth 2 % PADS 6 each  6 each Topical Daily Sondra Come, MD   6 each at 05/17/23 0904   docusate sodium (COLACE) capsule 100 mg  100 mg Oral BID Sondra Come, MD   100 mg at 05/17/23 2106   heparin injection 5,000 Units  5,000 Units Subcutaneous Q12H Sondra Come, MD   5,000 Units at 05/17/23 2106   hydrochlorothiazide (HYDRODIURIL) tablet 12.5 mg  12.5 mg Oral Daily Sondra Come, MD   12.5 mg at 05/17/23 1610   HYDROmorphone (DILAUDID) injection 0.5-1 mg  0.5-1 mg Intravenous Q2H PRN Sondra Come, MD   1 mg at 05/17/23 0437   lisinopril (ZESTRIL) tablet 40 mg  40 mg Oral Daily Sondra Come, MD   40 mg at 05/16/23 0917   ondansetron (ZOFRAN) injection 4 mg  4 mg Intravenous Q4H PRN Sondra Come, MD   4 mg at 05/17/23 1519   oxybutynin (DITROPAN) tablet 5 mg  5 mg Oral Q8H PRN Sondra Come, MD   5 mg at 05/17/23 2106   oxyCODONE  (Oxy IR/ROXICODONE) immediate release tablet 5-10 mg  5-10 mg Oral Q4H PRN Carman Ching, PA-C   5 mg at 05/17/23 1218   pantoprazole (PROTONIX) EC tablet 40 mg  40 mg Oral PRN Sondra Come, MD       potassium chloride (KLOR-CON M) CR tablet 10 mEq  10 mEq Oral Daily Sondra Come, MD   10 mEq at 05/17/23 0903   rosuvastatin (CRESTOR) tablet 5 mg  5 mg Oral Daily Sondra Come, MD   5 mg at 05/17/23 0902   Objective: Vital signs in last 24 hours: Temp:  [98.7 F (37.1 C)-99 F (37.2 C)] 98.8 F (37.1 C) (10/10 0751) Pulse Rate:  [86-93] 86 (10/10 0751) Resp:  [20] 20 (10/10 0751) BP: (119-141)/(77-84) 123/82 (10/10 0751) SpO2:  [78 %-95 %] 91 % (10/10 0751)  Intake/Output from previous day: 10/09 0701 - 10/10 0700 In: -  Out: 920 [Urine:650; Drains:270] Intake/Output this shift: No intake/output data recorded.  Physical Exam Vitals and nursing note reviewed.  Constitutional:      General: He is not in acute distress.    Appearance: He is not ill-appearing, toxic-appearing or diaphoretic.  HENT:     Head: Normocephalic and atraumatic.  Pulmonary:  Effort: Pulmonary effort is normal. No respiratory distress.  Abdominal:     Comments: Abdomen is soft with appropriate postoperative tenderness, no rigidity or rebound.  Incisions are clean, dry, and intact with overlying surgical adhesive.  No abdominal erythema or bruising noted.  LLQ JP drain remains in place draining serosanguineous fluid.  Skin:    General: Skin is warm and dry.  Neurological:     Mental Status: He is alert and oriented to person, place, and time.  Psychiatric:        Mood and Affect: Mood normal.        Behavior: Behavior normal.    Lab Results:  Recent Labs    05/16/23 0523  WBC 10.4  HGB 13.4  HCT 37.4*  PLT 259   BMET Recent Labs    05/16/23 0523  NA 134*  K 3.7  CL 102  CO2 25  GLUCOSE 121*  BUN 15  CREATININE 0.81  CALCIUM 8.8*   Assessment &  Plan: 72 year old male POD 3 from robotic assisted laparoscopic radical prostatectomy with bilateral pelvic lymph node dissection with Dr. Richardo Hanks for management of prostate cancer.  Slow return of bowel function, however he continues to pass flatus.  No significant findings on abdominal exam.  He continues to have ample drain output.  We discussed the critical importance of ambulation, incentive spirometry, and hydration and reducing postop atelectasis and promoting the return of bowel function.  My goals for him today are hourly incentive spirometry, 3 adequate walks with nursing, and pushing fluids.  He and his wife are in agreement with this plan.  Will send a drain creatinine this morning and plan to continue JP at least until it results.->  Drain creatinine serum(1.1), no evidence of urine leak.  Drain can be removed and plan for discharge today.  Carman Ching, PA-C 05/18/2023

## 2023-05-18 NOTE — Plan of Care (Signed)
  Problem: Education: Goal: Knowledge of the procedure and recovery process will improve Outcome: Adequate for Discharge   Problem: Bowel/Gastric: Goal: Gastrointestinal status for postoperative course will improve Outcome: Adequate for Discharge   Problem: Pain Management: Goal: General experience of comfort will improve Outcome: Adequate for Discharge   Problem: Skin Integrity: Goal: Demonstration of wound healing without infection will improve Outcome: Adequate for Discharge   Problem: Urinary Elimination: Goal: Ability to avoid or minimize complications of infection will improve Outcome: Adequate for Discharge Goal: Ability to achieve and maintain urine output will improve Outcome: Adequate for Discharge Goal: Home care management will improve Outcome: Adequate for Discharge   Problem: Education: Goal: Knowledge of General Education information will improve Description: Including pain rating scale, medication(s)/side effects and non-pharmacologic comfort measures 05/18/2023 1702 by Milderd Meager, RN Outcome: Adequate for Discharge 05/18/2023 1120 by Milderd Meager, RN Outcome: Progressing   Problem: Health Behavior/Discharge Planning: Goal: Ability to manage health-related needs will improve Outcome: Adequate for Discharge   Problem: Clinical Measurements: Goal: Ability to maintain clinical measurements within normal limits will improve Outcome: Adequate for Discharge Goal: Will remain free from infection Outcome: Adequate for Discharge Goal: Diagnostic test results will improve Outcome: Adequate for Discharge Goal: Respiratory complications will improve 05/18/2023 1702 by Milderd Meager, RN Outcome: Adequate for Discharge 05/18/2023 1120 by Milderd Meager, RN Outcome: Progressing Goal: Cardiovascular complication will be avoided Outcome: Adequate for Discharge   Problem: Activity: Goal: Risk for activity intolerance will decrease 05/18/2023 1702 by  Milderd Meager, RN Outcome: Adequate for Discharge 05/18/2023 1120 by Milderd Meager, RN Outcome: Progressing   Problem: Nutrition: Goal: Adequate nutrition will be maintained Outcome: Adequate for Discharge   Problem: Coping: Goal: Level of anxiety will decrease Outcome: Adequate for Discharge   Problem: Elimination: Goal: Will not experience complications related to bowel motility Outcome: Adequate for Discharge Goal: Will not experience complications related to urinary retention Outcome: Adequate for Discharge   Problem: Pain Managment: Goal: General experience of comfort will improve Outcome: Adequate for Discharge   Problem: Safety: Goal: Ability to remain free from injury will improve Outcome: Adequate for Discharge   Problem: Skin Integrity: Goal: Risk for impaired skin integrity will decrease Outcome: Adequate for Discharge

## 2023-05-18 NOTE — Plan of Care (Signed)
  Problem: Education: Goal: Knowledge of General Education information will improve Description: Including pain rating scale, medication(s)/side effects and non-pharmacologic comfort measures Outcome: Progressing   Problem: Clinical Measurements: Goal: Respiratory complications will improve Outcome: Progressing   Problem: Activity: Goal: Risk for activity intolerance will decrease Outcome: Progressing   

## 2023-05-19 ENCOUNTER — Ambulatory Visit: Payer: Medicare HMO | Admitting: Urology

## 2023-05-19 LAB — SURGICAL PATHOLOGY

## 2023-05-22 ENCOUNTER — Ambulatory Visit (INDEPENDENT_AMBULATORY_CARE_PROVIDER_SITE_OTHER): Payer: Medicare HMO | Admitting: Physician Assistant

## 2023-05-22 VITALS — BP 120/72 | HR 73 | Temp 98.2°F | Ht 68.0 in | Wt 187.0 lb

## 2023-05-22 DIAGNOSIS — C61 Malignant neoplasm of prostate: Secondary | ICD-10-CM

## 2023-05-22 MED ORDER — CIPROFLOXACIN HCL 500 MG PO TABS
500.0000 mg | ORAL_TABLET | Freq: Once | ORAL | Status: AC
Start: 2023-05-22 — End: 2023-05-22
  Administered 2023-05-22: 500 mg via ORAL

## 2023-05-22 NOTE — Progress Notes (Signed)
Catheter Removal  Patient is present today for a catheter removal.  8ml of water was drained from the balloon. A 20FR foley cath was removed from the bladder, no complications were noted. Patient tolerated well.  Performed by: Carman Ching, PA-C   Additional notes: Patient denies LE edema, redness, or pain; chest pain; shortness of breath; palpitations. He is having BMs and continues to tolerate PO. Abdomen is soft without rigidity or rebound. Incisions are clean, dry, and intact.  Cipro 500mg  x1 dose administered prior to Foley removal for UTI prevention.  Surgical pathology results shared with patient. We discussed negative margins and nodes.  We discussed anticipated postop urinary leakage. I specifically counseled him to anticipate leakage, wear absorbant products as needed for security, and that his first year postop will show the greatest improvement in leakage.   Follow up: Return in about 6 weeks (around 07/03/2023) for Postop f/u with Dr. Richardo Hanks.

## 2023-06-23 ENCOUNTER — Other Ambulatory Visit: Payer: Self-pay

## 2023-06-23 DIAGNOSIS — C61 Malignant neoplasm of prostate: Secondary | ICD-10-CM

## 2023-06-26 ENCOUNTER — Other Ambulatory Visit: Payer: Medicare HMO

## 2023-06-26 DIAGNOSIS — C61 Malignant neoplasm of prostate: Secondary | ICD-10-CM

## 2023-06-27 LAB — PSA: Prostate Specific Ag, Serum: <0.1 ng/mL (ref 0.0–4.0)

## 2023-06-29 ENCOUNTER — Ambulatory Visit: Payer: Medicare HMO | Admitting: Urology

## 2023-07-19 ENCOUNTER — Ambulatory Visit (INDEPENDENT_AMBULATORY_CARE_PROVIDER_SITE_OTHER): Payer: Medicare HMO | Admitting: Urology

## 2023-07-19 ENCOUNTER — Encounter: Payer: Self-pay | Admitting: Urology

## 2023-07-19 VITALS — BP 137/85 | HR 78 | Ht 68.0 in | Wt 184.0 lb

## 2023-07-19 DIAGNOSIS — N529 Male erectile dysfunction, unspecified: Secondary | ICD-10-CM

## 2023-07-19 DIAGNOSIS — Z8546 Personal history of malignant neoplasm of prostate: Secondary | ICD-10-CM

## 2023-07-19 DIAGNOSIS — C61 Malignant neoplasm of prostate: Secondary | ICD-10-CM

## 2023-07-19 NOTE — Patient Instructions (Signed)
Kegel Exercises  Kegel exercises can help strengthen your pelvic floor muscles. The pelvic floor is a group of muscles that support your rectum, small intestine, and bladder. In females, pelvic floor muscles also help support the uterus. These muscles help you control the flow of urine and stool (feces). Kegel exercises are painless and simple. They do not require any equipment. Your provider may suggest Kegel exercises to: Improve bladder and bowel control. Improve sexual response. Improve weak pelvic floor muscles after surgery to remove the uterus (hysterectomy) or after pregnancy, in females. Improve weak pelvic floor muscles after prostate gland removal or surgery, in males. Kegel exercises involve squeezing your pelvic floor muscles. These are the same muscles you squeeze when you try to stop the flow of urine or keep from passing gas. The exercises can be done while sitting, standing, or lying down, but it is best to vary your position. Ask your health care provider which exercises are safe for you. Do exercises exactly as told by your health care provider and adjust them as directed. Do not begin these exercises until told by your health care provider. Exercises How to do Kegel exercises: Squeeze your pelvic floor muscles tight. You should feel a tight lift in your rectal area. If you are a male, you should also feel a tightness in your vaginal area. Keep your stomach, buttocks, and legs relaxed. Hold the muscles tight for up to 10 seconds. Breathe normally. Relax your muscles for up to 10 seconds. Repeat as told by your health care provider. Repeat this exercise daily as told by your health care provider. Continue to do this exercise for at least 4-6 weeks, or for as long as told by your health care provider. You may be referred to a physical therapist who can help you learn more about how to do Kegel exercises. Depending on your condition, your health care provider may  recommend: Varying how long you squeeze your muscles. Doing several sets of exercises every day. Doing exercises for several weeks. Making Kegel exercises a part of your regular exercise routine. This information is not intended to replace advice given to you by your health care provider. Make sure you discuss any questions you have with your health care provider. Document Revised: 12/03/2020 Document Reviewed: 12/03/2020 Elsevier Patient Education  2024 Else[employer] Inc.   Kegel Exercises There are many reasons your provider may suggest Kegel exercises. This video will teach you how to do Kegel exercises. To view the content, go to this web address: [email or web address]  This video will expire on: 01/29/2025. If you need access to this video following this date, please reach out to the healthcare provider who assigned it to you. This information is not intended to replace advice given to you by your health care provider. Make sure you discuss any questions you have with your health care provider. Elsevier Patient Education  2024 [employer].

## 2023-07-19 NOTE — Progress Notes (Signed)
   07/19/2023 10:58 AM   James Haas March 12, 1951 [id number]  Reason for visit: Follow up prostate cancer status post radical prostatectomy  HPI: 72 year old male with intermediate risk prostate cancer who opted for robotic radical prostatectomy and lymph node dissection on 05/15/2023.  Pathology showed negative lymph nodes, Gleason score 3+4=7 favorable intermediate risk disease with focal left base extraprostatic extension but negative margins.  Initial postop PSA is undetectable.  We discussed the need to continue to monitor the PSA, as well as risk of recurrence potentially requiring salvage treatments in the future.  Postop expectations discussed.  He is overall doing well post-op, abdominal pain has continued to improve, returning to normal activities.  He is having some mild stress incontinence with standing and lifting that requires a depends.  He describes the leakage is a few drops.  He has not started Kegel exercises at this time.  He has not had any erections yet postop.  We discussed a trial of Cialis but he would like to hold off at this time, but may call in for prescription in the next few months.  Start Kegel exercises, okay to refer to pelvic floor physical therapy if persistent incontinence RTC 6 months PSA prior Okay to send in Cialis 5 mg daily if he desires before follow-up  Sondra Come, MD  [facility] [address], [address] [place], [place] [address] [phone]

## 2024-01-11 ENCOUNTER — Other Ambulatory Visit
Admission: RE | Admit: 2024-01-11 | Discharge: 2024-01-11 | Disposition: A | Discharge status: Home / Self Care | Attending: Urology | Admitting: Urology

## 2024-01-11 DIAGNOSIS — C61 Malignant neoplasm of prostate: Secondary | ICD-10-CM | POA: Diagnosis present

## 2024-01-11 LAB — PSA: Prostatic Specific Antigen: 0.02 ng/mL (ref 0.00–4.00)

## 2024-01-17 ENCOUNTER — Ambulatory Visit: Payer: Self-pay | Admitting: Urology

## 2024-01-17 VITALS — BP 134/79 | HR 73 | Ht 68.0 in | Wt 190.0 lb

## 2024-01-17 DIAGNOSIS — N529 Male erectile dysfunction, unspecified: Secondary | ICD-10-CM

## 2024-01-17 DIAGNOSIS — C61 Malignant neoplasm of prostate: Secondary | ICD-10-CM

## 2024-01-17 MED ORDER — TADALAFIL 5 MG PO TABS: 5.0000 mg | ORAL_TABLET | Freq: Every day | ORAL | 11 refills | Status: DC

## 2024-01-17 NOTE — Progress Notes (Signed)
   01/17/2024 9:54 AM   James Haas Apr 30, 1951 [id number]  Reason for visit: Follow up prostate cancer status post radical prostatectomy, ED  HPI: 73 year old male with intermediate risk prostate cancer who opted for robotic radical prostatectomy and lymph node dissection on 05/15/2023.  Pathology showed negative lymph nodes, Gleason score 3+4=7 favorable intermediate risk disease with focal left base extraprostatic extension but negative margins.  Initial postop PSA was undetectable.  We discussed the need to continue to monitor the PSA, as well as risk of recurrence potentially requiring salvage treatments in the future.  Postop expectations discussed.  Most recent PSA from 01/11/2024 remains undetectable at 0.02.  He is overall doing well.  Urinating with a much better stream than prior to surgery.  Having very minimal stress incontinence that does not require a pad.  Still has not had any erections postop, at a point where he is interested in trying Cialis.  Risk and benefits were discussed as well as postop expectations.  Start Cialis 5 mg daily, okay to take boost dose up to 20 mg/day RTC 6 months PSA prior  Lawerence Pressman, MD  [facility] [address], [address] [place], [place] [address] [phone]

## 2024-02-15 ENCOUNTER — Telehealth: Payer: Self-pay

## 2024-02-15 NOTE — Telephone Encounter (Signed)
 Pt LVM on the triage line asking if there were any updates on the PA for his tadalafil . Please advise.

## 2024-02-16 NOTE — Telephone Encounter (Signed)
 PAs are not attempted on Tadalafil /Sildenafil as insurance considers the medication to be elective.

## 2024-03-05 ENCOUNTER — Encounter: Payer: Self-pay | Admitting: Urology

## 2024-06-24 ENCOUNTER — Encounter: Payer: Self-pay | Admitting: Urology

## 2024-07-11 ENCOUNTER — Other Ambulatory Visit: Payer: Self-pay

## 2024-07-11 ENCOUNTER — Other Ambulatory Visit

## 2024-07-11 DIAGNOSIS — C61 Malignant neoplasm of prostate: Secondary | ICD-10-CM

## 2024-07-12 LAB — PSA: Prostate Specific Ag, Serum: <0.1 ng/mL (ref 0.0–4.0)

## 2024-07-16 ENCOUNTER — Ambulatory Visit: Admitting: Urology

## 2024-07-17 ENCOUNTER — Ambulatory Visit: Admitting: Urology

## 2024-07-30 ENCOUNTER — Ambulatory Visit: Admitting: Urology

## 2024-07-30 VITALS — BP 142/84 | HR 77

## 2024-07-30 DIAGNOSIS — C61 Malignant neoplasm of prostate: Secondary | ICD-10-CM | POA: Diagnosis not present

## 2024-07-30 DIAGNOSIS — N529 Male erectile dysfunction, unspecified: Secondary | ICD-10-CM

## 2024-07-30 MED ORDER — TADALAFIL 10 MG PO TABS: 10.0000 mg | ORAL_TABLET | Freq: Every day | ORAL | 4 refills | Status: AC

## 2024-07-30 NOTE — Progress Notes (Signed)
" ° °  07/30/2024 1:23 PM   James Haas 11/27/1950 [id number]  Reason for visit: Follow up prostate cancer, ED  History: Intermediate risk prostate cancer treated with robotic radical prostatectomy and lymph node dissection October 2024, pathology showed negative lymph nodes, Gleason score 3+4=7 favorable intermediate risk disease with focal left base extraprostatic extension but negative margins PSA has remained undetectable Had significant obstructive urinary symptoms prior, voiding with excellent stream since surgery  Physical Exam: BP (!) 142/84 (BP Location: Left Arm, Patient Position: Sitting, Cuff Size: Normal)   Pulse 77   SpO2 96%   Imaging/labs: PSA 07/11/2024 remains undetectable  Today: Urinating with an excellent stream, no significant incontinence, not needing a pad Persistent ED despite Cialis  5 mg daily, has had some improvement on that medication Overall doing well  Plan:   Prostate cancer: PSA remains undetectable after robotic radical prostatectomy October 2024, continue PSA monitoring every 6 months for the first 2 years, then can space to yearly ED: Will increase Cialis  to 10 mg daily with 10 mg boost dose RTC 6 months PSA prior   James JAYSON Burnet, MD  [facility] [address] [place], [place] [address] [phone]  "

## 2024-07-30 NOTE — Patient Instructions (Signed)
 Take 10 mg Cialis  daily, and you can take an additional 10 mg boost dose 1 hour prior to sexual activity  Understanding Erectile Dysfunction (ED)  What is ED? Erectile Dysfunction, or ED, is when a man has trouble getting or keeping an erection enough for sex. It is common and can happen at any age but is more common as men get older.  What Causes ED? ED can happen for many reasons, including:    Stress or feeling anxious Health problems like diabetes, heart disease, or high blood pressure Lifestyle habits like smoking, drinking too much alcohol, drug use, or not enough exercise Certain medicines(some blood pressure medications, antidepressants, sedatives)  Symptoms of ED:   Difficulty getting an erection Trouble keeping an erection during sex Reduced interest in sex  Treatment Options There are different ways to treat ED. Talk to your doctor to find whats best for you.  Lifestyle Changes   Exercise regularly Eat healthy foods Quit smoking and limit alcohol Weight loss Reduce stress  Medications   Oral medicines like Viagra(sildenafil) or Cialis (tadalafil ).  These are generic and affordable, the lowest price is at costplusdrugs.com.  These must be taken about an hour before sexual activity, and still require sexual stimulation to get an erection Side effects can include stuffy nose, headache, muscle pain, or changes in vision There is no risk of heart attack or stroke when medications are taken as directed These medications cannot be taken with nitrates for chest pain  Other Treatments    Penile injections-a medicine is injected directly into the penis to give you an immediate erection Devices like pump vacuum systems that help create an erection Surgery: Penile prosthesis for patients with no improvement with medicines or injections who are still interested in sexual activity.  Visit [email or web address] for more details.  Dr. Lovie is a urologist in [place] with  [facility] who performs penile prosthesis surgeries Counseling or Therapy    If ED is caused by stress, anxiety, or relationship issues, talking to a counselor or therapist can help.

## 2025-01-28 ENCOUNTER — Other Ambulatory Visit

## 2025-02-04 ENCOUNTER — Ambulatory Visit: Admitting: Urology
